# Patient Record
Sex: Male | Born: 1945
Health system: Southern US, Community
[De-identification: ages and names within clinical notes are randomized; demographics above are authoritative.]

## PROBLEM LIST (undated history)

## (undated) DIAGNOSIS — E559 Vitamin D deficiency, unspecified: Secondary | ICD-10-CM

## (undated) DIAGNOSIS — E119 Type 2 diabetes mellitus without complications: Secondary | ICD-10-CM

## (undated) DIAGNOSIS — M79606 Pain in leg, unspecified: Secondary | ICD-10-CM

## (undated) DIAGNOSIS — I10 Essential (primary) hypertension: Secondary | ICD-10-CM

## (undated) DIAGNOSIS — F17200 Nicotine dependence, unspecified, uncomplicated: Secondary | ICD-10-CM

## (undated) DIAGNOSIS — H538 Other visual disturbances: Secondary | ICD-10-CM

## (undated) DIAGNOSIS — F418 Other specified anxiety disorders: Secondary | ICD-10-CM

## (undated) DIAGNOSIS — E7800 Pure hypercholesterolemia, unspecified: Secondary | ICD-10-CM

## (undated) DIAGNOSIS — Z9889 Other specified postprocedural states: Secondary | ICD-10-CM

## (undated) DIAGNOSIS — M5416 Radiculopathy, lumbar region: Secondary | ICD-10-CM

## (undated) DIAGNOSIS — R269 Unspecified abnormalities of gait and mobility: Secondary | ICD-10-CM

## (undated) DIAGNOSIS — K7010 Alcoholic hepatitis without ascites: Secondary | ICD-10-CM

## (undated) HISTORY — DX: Other specified anxiety disorders: F41.8

## (undated) HISTORY — PX: COLONOSCOPY: SHX174

## (undated) HISTORY — DX: Other visual disturbances: H53.8

## (undated) HISTORY — DX: Unspecified abnormalities of gait and mobility: R26.9

## (undated) HISTORY — DX: Nicotine dependence, unspecified, uncomplicated: F17.200

## (undated) HISTORY — DX: Pure hypercholesterolemia, unspecified: E78.00

## (undated) HISTORY — DX: Alcoholic hepatitis without ascites: K70.10

## (undated) HISTORY — DX: Type 2 diabetes mellitus without complications: E11.9

## (undated) HISTORY — DX: Other specified postprocedural states: Z98.890

## (undated) HISTORY — DX: Pain in leg, unspecified: M79.606

## (undated) HISTORY — DX: Essential (primary) hypertension: I10

## (undated) HISTORY — PX: BRAIN SURGERY: SHX531

## (undated) HISTORY — DX: Vitamin D deficiency, unspecified: E55.9

## (undated) HISTORY — DX: Radiculopathy, lumbar region: M54.16

---

## 2019-09-10 LAB — CBC AND DIFFERENTIAL
HCT: 48 (ref 41–53)
Hemoglobin: 15.1 (ref 13.5–17.5)
Platelets: 193 (ref 150–399)
WBC: 11.8

## 2019-09-10 LAB — CBC: RBC: 5.22 — AB (ref 3.87–5.11)

## 2020-06-11 ENCOUNTER — Other Ambulatory Visit: Payer: Self-pay

## 2020-06-11 ENCOUNTER — Encounter: Payer: Self-pay | Admitting: Family

## 2020-06-11 ENCOUNTER — Ambulatory Visit (INDEPENDENT_AMBULATORY_CARE_PROVIDER_SITE_OTHER): Payer: Medicare Other | Admitting: Family

## 2020-06-11 VITALS — BP 164/92 | HR 70 | Temp 97.8°F | Resp 16 | Ht 67.0 in | Wt 164.0 lb

## 2020-06-11 DIAGNOSIS — I10 Essential (primary) hypertension: Secondary | ICD-10-CM

## 2020-06-11 DIAGNOSIS — Z1159 Encounter for screening for other viral diseases: Secondary | ICD-10-CM

## 2020-06-11 DIAGNOSIS — R630 Anorexia: Secondary | ICD-10-CM | POA: Diagnosis not present

## 2020-06-11 DIAGNOSIS — M25551 Pain in right hip: Secondary | ICD-10-CM

## 2020-06-11 DIAGNOSIS — H538 Other visual disturbances: Secondary | ICD-10-CM

## 2020-06-11 DIAGNOSIS — G4700 Insomnia, unspecified: Secondary | ICD-10-CM

## 2020-06-11 DIAGNOSIS — G3184 Mild cognitive impairment, so stated: Secondary | ICD-10-CM

## 2020-06-11 DIAGNOSIS — Z23 Encounter for immunization: Secondary | ICD-10-CM

## 2020-06-11 DIAGNOSIS — L309 Dermatitis, unspecified: Secondary | ICD-10-CM

## 2020-06-11 DIAGNOSIS — L602 Onychogryphosis: Secondary | ICD-10-CM

## 2020-06-11 DIAGNOSIS — G8929 Other chronic pain: Secondary | ICD-10-CM

## 2020-06-11 MED ORDER — AMLODIPINE BESYLATE 5 MG PO TABS: 5.0000 mg | ORAL_TABLET | Freq: Every day | ORAL | 3 refills | Status: DC

## 2020-06-11 MED ORDER — TETANUS-DIPHTH-ACELL PERTUSSIS 5-2.5-18.5 LF-MCG/0.5 IM SUSP: 0.5000 mL | Freq: Once | INTRAMUSCULAR | 0 refills | Status: AC

## 2020-06-11 MED ORDER — MIRTAZAPINE 7.5 MG PO TABS: 7.5000 mg | ORAL_TABLET | Freq: Every day | ORAL | 3 refills | Status: DC

## 2020-06-11 NOTE — Progress Notes (Signed)
Provider: Marlowe Sax FNP-C   Zadaya Cuadra, Nelda Bucks, NP  Patient Care Team: Burwell Bethel, Nelda Bucks, NP as PCP - General (Family Medicine)  Extended Emergency Contact Information Primary Emergency Contact: Elwin Sleight Mobile Phone: [phone] Relation: Other Secondary Emergency Contact: Milagros Evener Address: [address]          [place], [place] [address] Johnnette Litter of [place] Phone: [phone] Mobile Phone: [phone] Relation: Son  Code Status:Full Code  Goals of care: Advanced Directive information Advanced Directives 06/11/2020  Does Patient Have a Medical Advance Directive? No  Would patient like information on creating a medical advance directive? No - Patient declined     Chief Complaint  Patient presents with  . Establish Care    New Patient.    HPI:  Pt is a 74 y.o. male seen today for medical management of chronic diseases.He is here with daughter in law.He has a medical history of hypertension,hyperlipidemia though history limited due poor historian and memory issues.Has moved in with daughter and her husband used to take care of his own medication but apparently has not been taking his medication.  Hypertension -  Has missed his blood pressure medication more than two months.does not recall name of medication.No medication bottles brought to visit.[place] with daughter during visit concerning taking medication.Daughter states will be in charge of his medication from now on.  Insomnia - states not sleeping well at night.Daughter states stays in his room whole day watching TV rarely goes outside.He does not eat with family during visit.he takes his food to his bedroom.   Memory loss - forgetful.Not taking medication as above. He denies any symptoms of depression. Poor appetite - not eating well and skips meals.   Past Medical History:  Diagnosis Date  . Alcoholic hepatitis   . Anxiety associated with depression   . Blurred vision   . Gait disturbance    . High blood pressure    Per [facility] Patient Packet   . High cholesterol     Per [facility] New Patient Packet   . History of brain surgery   . Lumbar radiculopathy   . Lumbar radiculopathy   . Nicotine addiction   . Pain in leg, unspecified   . Type 2 diabetes mellitus ([place])   . Vitamin D deficiency    History reviewed. No pertinent surgical history.  Allergies  Allergen Reactions  . Penicillins     Allergies as of 06/11/2020   No Known Allergies     Medication List       Accurate as of June 11, 2020 11:59 PM. If you have any questions, ask your nurse or doctor.        amLODipine 5 MG tablet Commonly known as: NORVASC Take 1 tablet (5 mg total) by mouth daily. Started by: Sandrea Hughs, NP   mirtazapine 7.5 MG tablet Commonly known as: REMERON Take 1 tablet (7.5 mg total) by mouth at bedtime. Started by: Sandrea Hughs, NP   Tdap 5-2.5-18.5 LF-MCG/0.5 injection Commonly known as: BOOSTRIX Inject 0.5 mLs into the muscle once for 1 dose.       Review of Systems  Constitutional: Negative for chills, fatigue and fever.       Eats sparingly mostly eats dinner.  HENT: Negative for congestion, rhinorrhea, sinus pressure, sinus pain, sneezing, sore throat and trouble swallowing.   Eyes: Positive for visual disturbance. Negative for pain, discharge, redness and itching.  Respiratory: Negative for cough, chest tightness, shortness of breath and  wheezing.   Cardiovascular: Negative for chest pain, palpitations and leg swelling.  Gastrointestinal: Negative for abdominal pain, blood in stool, constipation, diarrhea, nausea and vomiting.  Endocrine: Negative for cold intolerance, heat intolerance, polydipsia, polyphagia and polyuria.  Genitourinary: Negative for difficulty urinating, dysuria, flank pain, frequency, hematuria and urgency.  Musculoskeletal: Positive for arthralgias and gait problem. Negative for joint swelling, myalgias and neck pain.  Skin: Negative for  color change, pallor, rash and wound.  Neurological: Negative for dizziness, speech difficulty, weakness, light-headedness, numbness and headaches.  Hematological: Does not bruise/bleed easily.  Psychiatric/Behavioral: Positive for sleep disturbance. Negative for agitation and confusion. The patient is not nervous/anxious.        Doesn't sleep at all for about a year.  Stays a lot in the room and doesn't feeling like doing anything.  Forgetful,     There is no immunization history on file for this patient. Pertinent  Health Maintenance Due  Topic Date Due  . COLONOSCOPY  Never done  . PNA vac Low Risk Adult (1 of 2 - PCV13) Never done  . INFLUENZA VACCINE  06/30/2020   Fall Risk  06/11/2020  Falls in the past year? 1  Number falls in past yr: 0  Injury with Fall? 0    Vitals:   06/11/20 1325 06/11/20 1549  BP: (!) 178/100 (!) 164/92  Pulse: 87 70  Resp: 16   Temp: 97.8 F (36.6 C)   SpO2: 98%   Weight: 164 lb (74.4 kg)   Height: '5\' 7"'$  (1.702 m)    Body mass index is 25.69 kg/m. Physical Exam Vitals reviewed.  Constitutional:      General: He is not in acute distress.    Appearance: He is overweight. He is not ill-appearing.  HENT:     Head: Normocephalic.     Right Ear: Tympanic membrane, ear canal and external ear normal. There is no impacted cerumen.     Left Ear: Tympanic membrane, ear canal and external ear normal. There is no impacted cerumen.     Nose: Nose normal. No congestion or rhinorrhea.     Mouth/Throat:     Mouth: Mucous membranes are moist.     Pharynx: Oropharynx is clear. No oropharyngeal exudate or posterior oropharyngeal erythema.  Eyes:     General: No scleral icterus.       Right eye: No discharge.        Left eye: No discharge.     Extraocular Movements: Extraocular movements intact.     Conjunctiva/sclera: Conjunctivae normal.     Pupils: Pupils are equal, round, and reactive to light.  Neck:     Vascular: No carotid bruit.    Cardiovascular:     Rate and Rhythm: Normal rate and regular rhythm.     Pulses: Normal pulses.     Heart sounds: Normal heart sounds. No murmur heard.  No friction rub. No gallop.   Pulmonary:     Effort: Pulmonary effort is normal. No respiratory distress.     Breath sounds: Normal breath sounds. No wheezing, rhonchi or rales.  Chest:     Chest wall: No tenderness.  Abdominal:     General: Bowel sounds are normal. There is no distension.     Palpations: Abdomen is soft. There is no mass.     Tenderness: There is no abdominal tenderness. There is no right CVA tenderness, left CVA tenderness, guarding or rebound.  Musculoskeletal:        General: No swelling.  Cervical back: Normal range of motion. No rigidity or tenderness.     Right hip: Normal.     Left hip: Tenderness present. No deformity or crepitus. Normal range of motion. Normal strength.     Right lower leg: No edema.     Left lower leg: No edema.  Lymphadenopathy:     Cervical: No cervical adenopathy.  Skin:    General: Skin is warm and dry.     Coloration: Skin is not pale.     Findings: No bruising, erythema, lesion or rash.     Comments: Chronic dry skin on antecubital and back of the neck.  Neurological:     Mental Status: He is alert.     Cranial Nerves: No cranial nerve deficit.     Sensory: No sensory deficit.     Motor: No weakness.     Coordination: Coordination normal.     Gait: Gait normal.  Psychiatric:        Mood and Affect: Mood normal.        Speech: Speech normal.        Behavior: Behavior normal.        Thought Content: Thought content normal.        Cognition and Memory: Memory is impaired.        Judgment: Judgment normal.     Comments: Argues with daughter during visit over medication     Labs reviewed: Significant Diagnostic Results in last 30 days:  No results found.  Assessment/Plan 1. Essential hypertension Uncontrolled.Has missed his blood medication does not recall the name  of the medication.B/p elevated today.Clonidine 0.1 mg tablet x 1 dose B/p rechecked trending down.Asymptomatic.will start on Amlodipine 5 mg tablet daily.Daughter advised to check B/p at home and notify if > 140/90 - will follow up    - CBC with Differential/Platelet - CMP with eGFR([facility]) - TSH; Future - Lipid panel; Future - amLODipine (NORVASC) 5 MG tablet; Take 1 tablet (5 mg total) by mouth daily.  Dispense: 90 tablet; Refill: 3  2. Insomnia, unspecified type Not sleeping at night though spends most time in the bedroom during the day.Encouraged to get out of his bedroom during the day and exercise by walking.will start on Remeron at bedtime for insomnia and appetite stimulant. - mirtazapine (REMERON) 7.5 MG tablet; Take 1 tablet (7.5 mg total) by mouth at bedtime.  Dispense: 30 tablet; Refill: 3  3. Decreased appetite Start on Remeron as below.   - mirtazapine (REMERON) 7.5 MG tablet; Take 1 tablet (7.5 mg total) by mouth at bedtime.  Dispense: 30 tablet; Refill: 3  4. Overgrown toenails Refer to podiatrist to trim overgrown toenails.  - Ambulatory referral to Podiatry  5. Chronic hip pain, right Tender to deep palpation.OTC Tylenol 500 mg tablet every 6 hrs as needed for pain.Will obtain imaging.  - DG Hip Unilat W OR W/O Pelvis 2-3 Views Left; Future  6. Eczema, unspecified type Advised to apply moisturizing ointment.Aquaphor daily and as needed.   7. Need for hepatitis C screening test No previous screening.Asymptomatic.  - Hep C Antibody  8. Need for Tdap vaccination Advised to get Tdap vaccine at his pharmacy. - Tdap (BOOSTRIX) 5-2.5-18.5 LF-MCG/0.5 injection; Inject 0.5 mLs into the muscle once for 1 dose.  Dispense: 0.5 mL; Refill: 0  9. Blurry vision, bilateral Unclear etiology though could be also related to his uncontrolled B/p due to not taking his blood pressure medication.Daughter will take over his medication management.Will refer to Ophthalmology.  -  Ambulatory referral to Ophthalmology  10. Mild Cognitive impairment  Daughter reports patient forgetful and not taking his medication as directed.Scored 18 out of 30 on MMSE.Has had decreased appetite. Will obtain lab work.will refer to Neurologist for further evaluation.  Family/ staff Communication: Reviewed plan of care with patient and daughter verbalized understanding.  Labs/tests ordered:  - CBC with Differential/Platelet - CMP with eGFR([facility]) - TSH; Future - Lipid panel; Future - Hep C Antibody  Next Appointment : 2 weeks for B/P check and decreased appetite.   Sandrea Hughs, NP

## 2020-06-12 LAB — CBC WITH DIFFERENTIAL/PLATELET
Absolute Monocytes: 636 cells/uL (ref 200–950)
Basophils Absolute: 30 cells/uL (ref 0–200)
Basophils Relative: 0.4 %
Eosinophils Absolute: 22 cells/uL (ref 15–500)
Eosinophils Relative: 0.3 %
HCT: 51.1 % — ABNORMAL HIGH (ref 38.5–50.0)
Hemoglobin: 17.1 g/dL (ref 13.2–17.1)
Lymphs Abs: 2161 cells/uL (ref 850–3900)
MCH: 30.6 pg (ref 27.0–33.0)
MCHC: 33.5 g/dL (ref 32.0–36.0)
MCV: 91.4 fL (ref 80.0–100.0)
MPV: 10.9 fL (ref 7.5–12.5)
Monocytes Relative: 8.6 %
Neutro Abs: 4551 cells/uL (ref 1500–7800)
Neutrophils Relative %: 61.5 %
Platelets: 176 10*3/uL (ref 140–400)
RBC: 5.59 10*6/uL (ref 4.20–5.80)
RDW: 12.7 % (ref 11.0–15.0)
Total Lymphocyte: 29.2 %
WBC: 7.4 10*3/uL (ref 3.8–10.8)

## 2020-06-12 LAB — COMPLETE METABOLIC PANEL WITH GFR
AG Ratio: 1.6 (calc) (ref 1.0–2.5)
ALT: 45 U/L (ref 9–46)
AST: 27 U/L (ref 10–35)
Albumin: 4.6 g/dL (ref 3.6–5.1)
Alkaline phosphatase (APISO): 78 U/L (ref 35–144)
BUN/Creatinine Ratio: 10 (calc) (ref 6–22)
BUN: 15 mg/dL (ref 7–25)
CO2: 25 mmol/L (ref 20–32)
Calcium: 10 mg/dL (ref 8.6–10.3)
Chloride: 104 mmol/L (ref 98–110)
Creat: 1.47 mg/dL — ABNORMAL HIGH (ref 0.70–1.18)
GFR, Est African American: 54 mL/min/{1.73_m2} — ABNORMAL LOW (ref >=60)
GFR, Est Non African American: 47 mL/min/{1.73_m2} — ABNORMAL LOW (ref >=60)
Globulin: 2.8 g/dL (calc) (ref 1.9–3.7)
Glucose, Bld: 103 mg/dL — ABNORMAL HIGH (ref 65–99)
Potassium: 4.1 mmol/L (ref 3.5–5.3)
Sodium: 142 mmol/L (ref 135–146)
Total Bilirubin: 0.7 mg/dL (ref 0.2–1.2)
Total Protein: 7.4 g/dL (ref 6.1–8.1)

## 2020-06-12 LAB — HEPATITIS C ANTIBODY
Hepatitis C Ab: NONREACTIVE
SIGNAL TO CUT-OFF: 0.02 (ref <1.00)

## 2020-06-14 ENCOUNTER — Ambulatory Visit: Payer: Medicare Other | Admitting: Family

## 2020-06-19 ENCOUNTER — Other Ambulatory Visit: Payer: Self-pay

## 2020-06-19 DIAGNOSIS — R739 Hyperglycemia, unspecified: Secondary | ICD-10-CM

## 2020-06-21 ENCOUNTER — Ambulatory Visit: Payer: Self-pay | Admitting: Family

## 2020-06-21 ENCOUNTER — Other Ambulatory Visit: Payer: Self-pay

## 2020-06-25 ENCOUNTER — Other Ambulatory Visit: Payer: Medicare Other

## 2020-06-25 ENCOUNTER — Other Ambulatory Visit: Payer: Self-pay

## 2020-06-25 ENCOUNTER — Encounter: Payer: Self-pay | Admitting: Family

## 2020-06-25 ENCOUNTER — Ambulatory Visit (INDEPENDENT_AMBULATORY_CARE_PROVIDER_SITE_OTHER): Payer: Medicare Other | Admitting: Family

## 2020-06-25 VITALS — BP 130/80 | HR 74 | Temp 97.3°F | Resp 16 | Ht 67.0 in | Wt 158.6 lb

## 2020-06-25 DIAGNOSIS — G3184 Mild cognitive impairment, so stated: Secondary | ICD-10-CM

## 2020-06-25 DIAGNOSIS — M25551 Pain in right hip: Secondary | ICD-10-CM

## 2020-06-25 DIAGNOSIS — Z7289 Other problems related to lifestyle: Secondary | ICD-10-CM

## 2020-06-25 DIAGNOSIS — Z8603 Personal history of neoplasm of uncertain behavior: Secondary | ICD-10-CM

## 2020-06-25 DIAGNOSIS — I10 Essential (primary) hypertension: Secondary | ICD-10-CM

## 2020-06-25 DIAGNOSIS — E785 Hyperlipidemia, unspecified: Secondary | ICD-10-CM

## 2020-06-25 DIAGNOSIS — Z9889 Other specified postprocedural states: Secondary | ICD-10-CM

## 2020-06-25 DIAGNOSIS — F17200 Nicotine dependence, unspecified, uncomplicated: Secondary | ICD-10-CM

## 2020-06-25 DIAGNOSIS — G8929 Other chronic pain: Secondary | ICD-10-CM

## 2020-06-25 DIAGNOSIS — F418 Other specified anxiety disorders: Secondary | ICD-10-CM | POA: Diagnosis not present

## 2020-06-25 DIAGNOSIS — F1090 Alcohol use, unspecified, uncomplicated: Secondary | ICD-10-CM

## 2020-06-25 DIAGNOSIS — Z716 Tobacco abuse counseling: Secondary | ICD-10-CM

## 2020-06-25 DIAGNOSIS — Z789 Other specified health status: Secondary | ICD-10-CM

## 2020-06-25 MED ORDER — SERTRALINE HCL 25 MG PO TABS: 50.0000 mg | ORAL_TABLET | Freq: Every day | ORAL | 0 refills | Status: DC

## 2020-06-25 MED ORDER — SERTRALINE HCL 25 MG PO TABS: 25.0000 mg | ORAL_TABLET | Freq: Every day | ORAL | 0 refills | Status: DC

## 2020-06-25 MED ORDER — ACETAMINOPHEN 500 MG PO TABS: 500.0000 mg | ORAL_TABLET | Freq: Four times a day (QID) | ORAL | 0 refills | Status: DC | PRN

## 2020-06-25 NOTE — Patient Instructions (Signed)
-   please get right hip X-ray done at [facility] [place]  - Referral placed to Neurologist specialist office will call you.

## 2020-06-25 NOTE — Progress Notes (Signed)
Provider: Marlowe Sax FNP-C  Terris Bodin, Nelda Bucks, NP  Patient Care Team: Kyah Buesing, Nelda Bucks, NP as PCP - General (Family Medicine)  Extended Emergency Contact Information Primary Emergency Contact: Elwin Sleight Mobile Phone: [phone] Relation: Other Secondary Emergency Contact: Milagros Evener Address: [address]          [place], [place] [address] Johnnette Litter of [place] Phone: [phone] Mobile Phone: [phone] Relation: Son  Code Status: Full Code  Goals of care: Advanced Directive information Advanced Directives 06/25/2020  Does Patient Have a Medical Advance Directive? No  Would patient like information on creating a medical advance directive? No - Patient declined     Chief Complaint  Patient presents with  . Acute Visit    Mental Changes.    HPI:  Pt is a 74 y.o. male seen today for an acute visit for evaluation of mental status changes.He was here 06/11/2020 to establish care with [facility] for medical management of chronic issues.He didn't have his medication bottles and had not take his blood pressure medication for several months.Daughter was not sure if he was on other medication.His medical records  from previous PCP in [place] were not available for review.Records have been obtained today Dr. Costella Hatcher [facility] encounter 07/18/2019 indicates medical history of Hypertension,Hyperlipidemia,lumbar radiculopathy,leg pain,Anxiety with Depression,Alcohol abuse,cigarrette smoking among other condition.He is here today with the son who states patient also had a brain tumor removed on right side around 2009 and a metal plate was placed.He states tumor was not cancerous.He wonders whether tumor could have recurred and causing patient's forgetfulness.Son states patient does things then will argue that he didn't do it.He will use the toilet and not wipe the toilet seat when ask he will say did uses the toilet.he tends to ask questions over and over.     Remeron was prescribed on previous visit for appetite and insomnia.He states appetite has improved and sleeping better after the 11 pm news. Son reports patient cries at sometimes staying all his family members are dead though has some nieces and nephews.   Alcohol use - has not drunk alcohol for the past three days per son.  Smoking cigarettes - smokes 5-10 cigarettes per day per son but patient thinks he smokes only one per day.   He was referred to podiatrist on previous visit states has appointment on 06/27/2020.Also referred to Ophthalmologist for blurry vision but has not had a call yet.  Right hip - X-ray ordered on previous visit but has not gone.son states will taking him today.   Hypertension - His blood pressure has improved.states taking amlodipine as directed on previous visit. No home readings for review.   Past Medical History:  Diagnosis Date  . Alcoholic hepatitis   . Anxiety associated with depression   . Blurred vision   . Gait disturbance   . High blood pressure    Per [facility] Patient Packet   . High cholesterol     Per [facility] New Patient Packet   . History of brain surgery   . Lumbar radiculopathy   . Lumbar radiculopathy   . Nicotine addiction   . Pain in leg, unspecified   . Type 2 diabetes mellitus ([place])   . Vitamin D deficiency    History reviewed. No pertinent surgical history.  Allergies  Allergen Reactions  . Penicillins     Outpatient Encounter Medications as of 06/25/2020  Medication Sig  . amLODipine (NORVASC) 5 MG tablet Take 1 tablet (  5 mg total) by mouth daily.  . mirtazapine (REMERON) 7.5 MG tablet Take 1 tablet (7.5 mg total) by mouth at bedtime.   No facility-administered encounter medications on file as of 06/25/2020.    Review of Systems  Constitutional: Negative for chills, fatigue and fever.       Appetite has improve.   HENT: Negative for congestion, rhinorrhea, sinus pressure, sinus pain, sneezing and sore throat.   Eyes:  Positive for visual disturbance. Negative for discharge, redness and itching.       Blurry vision   Respiratory: Negative for cough, chest tightness, shortness of breath and wheezing.   Cardiovascular: Negative for chest pain, palpitations and leg swelling.  Gastrointestinal: Negative for abdominal distention, abdominal pain, constipation, diarrhea, nausea and vomiting.  Musculoskeletal: Positive for arthralgias and gait problem.       Right hip pain   Skin: Negative for color change, pallor and rash.  Neurological: Negative for dizziness, seizures, speech difficulty, weakness, light-headedness, numbness and headaches.  Psychiatric/Behavioral: Negative for agitation, behavioral problems and confusion. The patient is nervous/anxious.     Immunization History  Administered Date(s) Administered  . Pneumococcal Conjugate-13 07/02/2009   Pertinent  Health Maintenance Due  Topic Date Due  . COLONOSCOPY  Never done  . PNA vac Low Risk Adult (2 of 2 - PPSV23) 04/16/2012  . INFLUENZA VACCINE  06/30/2020   Fall Risk  06/25/2020 06/11/2020  Falls in the past year? 0 1  Number falls in past yr: 0 0  Injury with Fall? 0 0    Vitals:   06/25/20 0849  BP: (!) 130/80  Pulse: 74  Resp: 16  Temp: (!) 97.3 F (36.3 C)  SpO2: 94%  Weight: 158 lb 9.6 oz (71.9 kg)  Height: 5\' 7"  (1.702 m)   Body mass index is 24.84 kg/m. Physical Exam Vitals reviewed.  Constitutional:      General: He is not in acute distress.    Appearance: He is normal weight. He is not ill-appearing.  HENT:     Head: Normocephalic.     Nose: No congestion or rhinorrhea.     Mouth/Throat:     Mouth: Mucous membranes are moist.     Pharynx: Oropharynx is clear. No oropharyngeal exudate or posterior oropharyngeal erythema.  Eyes:     General: No scleral icterus.       Right eye: No discharge.        Left eye: No discharge.     Extraocular Movements: Extraocular movements intact.     Conjunctiva/sclera: Conjunctivae  normal.     Pupils: Pupils are equal, round, and reactive to light.  Neck:     Vascular: No carotid bruit.  Cardiovascular:     Rate and Rhythm: Normal rate and regular rhythm.     Pulses: Normal pulses.     Heart sounds: Normal heart sounds. No murmur heard.  No friction rub. No gallop.   Pulmonary:     Effort: Pulmonary effort is normal. No respiratory distress.     Breath sounds: Normal breath sounds. No wheezing, rhonchi or rales.  Chest:     Chest wall: No tenderness.  Abdominal:     General: Bowel sounds are normal. There is no distension.     Palpations: Abdomen is soft. There is no mass.     Tenderness: There is no abdominal tenderness. There is no right CVA tenderness, left CVA tenderness, guarding or rebound.  Musculoskeletal:        General: No swelling.  Cervical back: Normal range of motion. No rigidity or tenderness.     Right hip: Tenderness present. No crepitus. Normal range of motion. Normal strength.     Left hip: No bony tenderness or crepitus. Normal range of motion. Normal strength.     Right lower leg: No edema.     Left lower leg: No edema.     Comments: Unsteady gait walks with a cane   Lymphadenopathy:     Cervical: No cervical adenopathy.  Skin:    General: Skin is warm and dry.     Coloration: Skin is not pale.     Findings: No bruising, erythema or rash.  Neurological:     Mental Status: Mental status is at baseline.     Cranial Nerves: No cranial nerve deficit.     Motor: No weakness.     Gait: Gait abnormal.  Psychiatric:        Mood and Affect: Mood is anxious and depressed.        Speech: Speech normal.        Behavior: Behavior is cooperative.        Thought Content: Thought content normal.        Cognition and Memory: Memory is impaired.        Judgment: Judgment normal.     Comments: Argues with son during visit      Labs reviewed: Recent Labs    06/11/20 1549  NA 142  K 4.1  CL 104  CO2 25  GLUCOSE 103*  BUN 15    CREATININE 1.47*  CALCIUM 10.0   Recent Labs    06/11/20 1549  AST 27  ALT 45  BILITOT 0.7  PROT 7.4   Recent Labs    09/10/19 0000 06/11/20 1549  WBC 11.8 7.4  NEUTROABS  --  4,551  HGB 15.1 17.1  HCT 48 51.1*  MCV  --  91.4  PLT 193 176    Significant Diagnostic Results in last 30 days:  No results found.  Assessment/Plan 1. Mild cognitive impairment Scored 19/30 on MMSE previous was 18/30  forgetful.Has history of right brain Tumor removal ? 2009 with plate placement.His poor dietary intake,alcohol uses could be a contributory factor for his memory loss.Will rule out acute abnormalities. - Ambulatory referral to Neurology for further evaluation. - Vitamin B12 - RPR - HIV Antibody (routine testing w rflx) - TSH  2. Hyperlipidemia LDL goal <100 Previous records indicates was on Statin.will recheck lipid panel then start on statin if indicated.   3. Depression with anxiety Cries sometimes tends to stay in his room and eats in his room away from family. Start on Sertraline.side effects discussed. - sertraline (ZOLOFT) 25 MG tablet; Take 1 tablet (25 mg total) by mouth daily.  Dispense: 30 tablet; Refill: 0 - has upcoming follow up appointment.   4. Essential hypertension B/p at goal today.continue on amlodipine 5 mg tablet daily.  - TSH  5. Chronic hip pain, right X-ray ordered recently but did get it done.son will take him for imaging today. Advised to take Tylenol as below.  - acetaminophen (TYLENOL) 500 MG tablet; Take 1 tablet (500 mg total) by mouth every 6 (six) hours as needed.  Dispense: 30 tablet; Refill: 0  6. Hx of excision of tumor of brain meninges Per son tumor removed on right side of the head around 2009.No records for review.Incision scar noted on right side of the head.  7. Alcohol use Has not had alcohol in  the past three days.Ceassation advised.   8. Tobacco use disorder Smokes 5-10 cigarettes per day.Ceassation advised.will consider  switching to Wellbutrin for both depression and smoking cessation.   9. Encounter for smoking cessation counseling Not ready to quit.will continue to encourage smoking cessation.   Family/ staff Communication: Reviewed plan of care with patient and son verbalized understanding.   Labs/tests ordered:  - Vitamin B12 - RPR - HIV Antibody (routine testing w rflx) - TSH - Lipid panel   Next Appointment: Has appointment 07/05/2020.    Sandrea Hughs, NP

## 2020-06-27 ENCOUNTER — Other Ambulatory Visit: Payer: Self-pay

## 2020-06-27 ENCOUNTER — Ambulatory Visit (INDEPENDENT_AMBULATORY_CARE_PROVIDER_SITE_OTHER): Payer: Medicare Other | Admitting: Podiatry

## 2020-06-27 DIAGNOSIS — B351 Tinea unguium: Secondary | ICD-10-CM | POA: Diagnosis not present

## 2020-06-27 NOTE — Progress Notes (Signed)
  Subjective:  Patient ID: Alexander Pruitt, male    DOB: August 08, 1946,  MRN: [id number]  Chief Complaint  Patient presents with  . Nail Problem    pt is here for bil nail trim.     74 y.o. male presents with the above complaint. History confirmed with patient. Denies DM, other significant medical issues. No pain at the nails.  Objective:  Physical Exam: warm, good capillary refill, no trophic changes or ulcerative lesions, normal DP and PT pulses and normal sensory exam. Elongated toenails with dystrophic changes of hallux Left Foot: normal exam, no swelling, tenderness, instability; ligaments intact, full range of motion of all ankle/foot joints  Right Foot: normal exam, no swelling, tenderness, instability; ligaments intact, full range of motion of all ankle/foot joints   Assessment:   1. Onychomycosis      Plan:  Patient was evaluated and treated and all questions answered.  Onychomycosis  -Does not meet criteria for routine care. Nails debrided today, courtesy. Advised further debridement would likely be non-covered.  Procedure: Nail Debridement Type of Debridement: manual, sharp debridement. Instrumentation: Nail nipper, rotary burr. Number of Nails: 10    No follow-ups on file.

## 2020-07-01 ENCOUNTER — Ambulatory Visit
Admission: RE | Admit: 2020-07-01 | Discharge: 2020-07-01 | Disposition: A | Discharge status: Home / Self Care | Payer: Medicare Other | Source: Ambulatory Visit | Attending: Family | Admitting: Family

## 2020-07-01 ENCOUNTER — Other Ambulatory Visit: Payer: Medicare Other

## 2020-07-01 ENCOUNTER — Other Ambulatory Visit: Payer: Self-pay

## 2020-07-01 ENCOUNTER — Other Ambulatory Visit: Payer: Self-pay | Admitting: Family

## 2020-07-01 DIAGNOSIS — M1612 Unilateral primary osteoarthritis, left hip: Secondary | ICD-10-CM | POA: Diagnosis not present

## 2020-07-01 DIAGNOSIS — G8929 Other chronic pain: Secondary | ICD-10-CM

## 2020-07-01 DIAGNOSIS — R739 Hyperglycemia, unspecified: Secondary | ICD-10-CM | POA: Diagnosis not present

## 2020-07-01 DIAGNOSIS — G3184 Mild cognitive impairment, so stated: Secondary | ICD-10-CM | POA: Diagnosis not present

## 2020-07-01 DIAGNOSIS — M25551 Pain in right hip: Secondary | ICD-10-CM

## 2020-07-01 DIAGNOSIS — I10 Essential (primary) hypertension: Secondary | ICD-10-CM | POA: Diagnosis not present

## 2020-07-02 LAB — HEMOGLOBIN A1C
Hgb A1c MFr Bld: 5.2 % of total Hgb (ref <5.7)
Mean Plasma Glucose: 103 (calc)
eAG (mmol/L): 5.7 (calc)

## 2020-07-02 LAB — LIPID PANEL
Cholesterol: 228 mg/dL — ABNORMAL HIGH (ref <200)
HDL: 51 mg/dL (ref >=40)
LDL Cholesterol (Calc): 157 mg/dL (calc) — ABNORMAL HIGH
Non-HDL Cholesterol (Calc): 177 mg/dL (calc) — ABNORMAL HIGH (ref <130)
Total CHOL/HDL Ratio: 4.5 (calc) (ref <5.0)
Triglycerides: 96 mg/dL (ref <150)

## 2020-07-02 LAB — SYPHILIS: RPR W/REFLEX TO RPR TITER AND TREPONEMAL ANTIBODIES, TRADITIONAL SCREENING AND DIAGNOSIS ALGORITHM: RPR Ser Ql: NONREACTIVE

## 2020-07-02 LAB — TSH: TSH: 1.78 mIU/L (ref 0.40–4.50)

## 2020-07-02 LAB — VITAMIN B12: Vitamin B-12: 748 pg/mL (ref 200–1100)

## 2020-07-02 LAB — HIV ANTIBODY (ROUTINE TESTING W REFLEX): HIV 1&2 Ab, 4th Generation: NONREACTIVE

## 2020-07-03 ENCOUNTER — Ambulatory Visit: Payer: Medicare Other | Admitting: Diagnostic Neuroimaging

## 2020-07-03 ENCOUNTER — Encounter: Payer: Self-pay | Admitting: Diagnostic Neuroimaging

## 2020-07-03 ENCOUNTER — Telehealth: Payer: Self-pay | Admitting: *Deleted

## 2020-07-03 NOTE — Telephone Encounter (Signed)
Patient was no show for new patient appointment today. 

## 2020-07-04 ENCOUNTER — Other Ambulatory Visit: Payer: Self-pay

## 2020-07-04 DIAGNOSIS — E785 Hyperlipidemia, unspecified: Secondary | ICD-10-CM

## 2020-07-04 MED ORDER — ATORVASTATIN CALCIUM 10 MG PO TABS: 10.0000 mg | ORAL_TABLET | Freq: Every day | ORAL | 0 refills | Status: DC

## 2020-07-04 NOTE — Progress Notes (Signed)
cmp

## 2020-07-04 NOTE — Addendum Note (Signed)
Addended by: Bonney Leitz T on: 07/04/2020 08:52 AM   Modules accepted: Orders

## 2020-07-05 ENCOUNTER — Ambulatory Visit (INDEPENDENT_AMBULATORY_CARE_PROVIDER_SITE_OTHER): Payer: Medicare Other | Admitting: Family

## 2020-07-05 ENCOUNTER — Encounter: Payer: Self-pay | Admitting: Family

## 2020-07-05 ENCOUNTER — Other Ambulatory Visit: Payer: Self-pay

## 2020-07-05 VITALS — BP 150/90 | HR 60 | Temp 98.0°F | Resp 16 | Ht 67.0 in | Wt 160.0 lb

## 2020-07-05 DIAGNOSIS — M25551 Pain in right hip: Secondary | ICD-10-CM | POA: Diagnosis not present

## 2020-07-05 DIAGNOSIS — E785 Hyperlipidemia, unspecified: Secondary | ICD-10-CM

## 2020-07-05 DIAGNOSIS — R630 Anorexia: Secondary | ICD-10-CM

## 2020-07-05 DIAGNOSIS — F418 Other specified anxiety disorders: Secondary | ICD-10-CM | POA: Diagnosis not present

## 2020-07-05 DIAGNOSIS — G4700 Insomnia, unspecified: Secondary | ICD-10-CM

## 2020-07-05 DIAGNOSIS — G8929 Other chronic pain: Secondary | ICD-10-CM

## 2020-07-05 DIAGNOSIS — E538 Deficiency of other specified B group vitamins: Secondary | ICD-10-CM

## 2020-07-05 MED ORDER — MIRTAZAPINE 7.5 MG PO TABS: 7.5000 mg | ORAL_TABLET | Freq: Every day | ORAL | 1 refills | Status: DC

## 2020-07-05 MED ORDER — SERTRALINE HCL 25 MG PO TABS: 25.0000 mg | ORAL_TABLET | Freq: Every day | ORAL | 1 refills | Status: DC

## 2020-07-05 MED ORDER — VITAMIN B-12 1000 MCG PO TABS: 1000.0000 ug | ORAL_TABLET | Freq: Every day | ORAL | 1 refills | Status: DC

## 2020-07-05 NOTE — Progress Notes (Signed)
Provider: Marlowe Sax FNP-C  Jeramie Scogin, Nelda Bucks, NP  Patient Care Team: Quayshaun Hubbert, Nelda Bucks, NP as PCP - General (Family Medicine)  Extended Emergency Contact Information Primary Emergency Contact: Elwin Sleight Mobile Phone: [phone] Relation: Other Secondary Emergency Contact: Milagros Evener Address: [address]          [place], [place] [address] Johnnette Litter of [place] Phone: [phone] Mobile Phone: [phone] Relation: Son  Code Status:  DNR Goals of care: Advanced Directive information Advanced Directives 07/05/2020  Does Patient Have a Medical Advance Directive? No  Would patient like information on creating a medical advance directive? No - Patient declined     Chief Complaint  Patient presents with   Follow-up    2 week follow up    HPI:  Pt is a 74 y.o. male seen today for an acute visit for 2 weeks for follow up depression,anxiety,loss of appetite and insomnia.He was here 06/25/2020 with similar complains and memory loss.Remeron and sertraline was initiated.He is here with daughter.he states feeling much better.Daughter states now sleeps through the night.appetite has improved though still eats in his bedroom. He reports no side effects.His lab work reviewed chol 226,LDL 157 Lipitor was ordered but has not pick it up.Vit B12 was on the low range normal.He states he needs the energy.  Labs discussed with patient and daughter advised to pick up Lipitor    Past Medical History:  Diagnosis Date   Alcoholic hepatitis    Anxiety associated with depression    Blurred vision    Gait disturbance    High blood pressure    Per [place] New Patient Packet    High cholesterol     Per [facility] New Patient Packet    History of brain surgery    Lumbar radiculopathy    Lumbar radiculopathy    Nicotine addiction    Pain in leg, unspecified    Type 2 diabetes mellitus ([place])    Vitamin D deficiency    History reviewed. No pertinent surgical  history.  Allergies  Allergen Reactions   Penicillins     Outpatient Encounter Medications as of 07/05/2020  Medication Sig   acetaminophen (TYLENOL) 500 MG tablet Take 1 tablet (500 mg total) by mouth every 6 (six) hours as needed.   amLODipine (NORVASC) 5 MG tablet Take 1 tablet (5 mg total) by mouth daily.   atorvastatin (LIPITOR) 10 MG tablet Take 1 tablet (10 mg total) by mouth daily.   mirtazapine (REMERON) 7.5 MG tablet Take 1 tablet (7.5 mg total) by mouth at bedtime.   sertraline (ZOLOFT) 25 MG tablet Take 1 tablet (25 mg total) by mouth daily.   No facility-administered encounter medications on file as of 07/05/2020.    Review of Systems  Constitutional: Negative for chills, fatigue and fever.       Appetite has improved   HENT: Negative for congestion, rhinorrhea, sinus pressure, sinus pain, sneezing, sore throat and trouble swallowing.   Eyes: Negative for pain, discharge, redness, itching and visual disturbance.  Respiratory: Negative for cough, chest tightness, shortness of breath and wheezing.   Cardiovascular: Negative for chest pain, palpitations and leg swelling.  Gastrointestinal: Negative for abdominal distention, abdominal pain, constipation, diarrhea, nausea and vomiting.  Endocrine: Negative for heat intolerance, polydipsia, polyphagia and polyuria.  Genitourinary: Negative for difficulty urinating, dysuria, frequency and urgency.  Musculoskeletal: Positive for arthralgias and gait problem.       Right hip pain awaiting orthopedic appointment   Skin: Negative for color  change, pallor and rash.  Neurological: Negative for dizziness, speech difficulty, weakness, light-headedness, numbness and headaches.  Hematological: Does not bruise/bleed easily.  Psychiatric/Behavioral: Negative for agitation. The patient is not nervous/anxious.        Sleeps better since starting on Remeron     Immunization History  Administered Date(s) Administered   Pneumococcal  Conjugate-13 07/02/2009   Pertinent  Health Maintenance Due  Topic Date Due   COLONOSCOPY  Never done   PNA vac Low Risk Adult (2 of 2 - PPSV23) 04/16/2012   INFLUENZA VACCINE  06/30/2020   Fall Risk  07/05/2020 06/25/2020 06/11/2020  Falls in the past year? 0 0 1  Number falls in past yr: 0 0 0  Injury with Fall? 0 0 0    Vitals:   07/05/20 0824  BP: (!) 150/90  Pulse: 60  Resp: 16  Temp: 98 F (36.7 C)  SpO2: 98%  Weight: 160 lb (72.6 kg)  Height: 5\' 7"  (1.702 m)   Body mass index is 25.06 kg/m. Physical Exam Vitals reviewed.  Constitutional:      General: He is not in acute distress.    Appearance: He is not ill-appearing.  HENT:     Head: Normocephalic.     Mouth/Throat:     Mouth: Mucous membranes are moist.     Pharynx: Oropharynx is clear. No oropharyngeal exudate or posterior oropharyngeal erythema.  Eyes:     General: No scleral icterus.       Right eye: No discharge.        Left eye: No discharge.     Extraocular Movements: Extraocular movements intact.     Conjunctiva/sclera: Conjunctivae normal.     Pupils: Pupils are equal, round, and reactive to light.  Cardiovascular:     Rate and Rhythm: Normal rate and regular rhythm.     Pulses: Normal pulses.     Heart sounds: Normal heart sounds. No murmur heard.  No friction rub. No gallop.   Pulmonary:     Effort: Pulmonary effort is normal. No respiratory distress.     Breath sounds: Normal breath sounds. No wheezing, rhonchi or rales.  Chest:     Chest wall: No tenderness.  Abdominal:     General: Bowel sounds are normal. There is no distension.     Palpations: Abdomen is soft. There is no mass.     Tenderness: There is no abdominal tenderness. There is no right CVA tenderness, left CVA tenderness, guarding or rebound.  Musculoskeletal:     Right lower leg: No edema.     Left lower leg: No edema.     Comments: Unsteady gait walks with a cane.right hip pain worst with movement.   Skin:    General:  Skin is warm.     Coloration: Skin is not pale.     Findings: No bruising, erythema or rash.     Comments: Bilateral lower extremities dry scaly skin has improved with use of ointment  Neurological:     Mental Status: He is alert. Mental status is at baseline.     Cranial Nerves: No cranial nerve deficit.     Sensory: No sensory deficit.     Motor: No weakness.     Gait: Gait abnormal.  Psychiatric:        Mood and Affect: Mood normal.        Behavior: Behavior normal.        Thought Content: Thought content normal.  Judgment: Judgment normal.     Labs reviewed: Recent Labs    06/11/20 1549  NA 142  K 4.1  CL 104  CO2 25  GLUCOSE 103*  BUN 15  CREATININE 1.47*  CALCIUM 10.0   Recent Labs    06/11/20 1549  AST 27  ALT 45  BILITOT 0.7  PROT 7.4   Recent Labs    09/10/19 0000 06/11/20 1549  WBC 11.8 7.4  NEUTROABS  --  4,551  HGB 15.1 17.1  HCT 48 51.1*  MCV  --  91.4  PLT 193 176   Lab Results  Component Value Date   TSH 1.78 07/01/2020   Lab Results  Component Value Date   HGBA1C 5.2 07/01/2020   Lab Results  Component Value Date   CHOL 228 (H) 07/01/2020   HDL 51 07/01/2020   LDLCALC 157 (H) 07/01/2020   TRIG 96 07/01/2020   CHOLHDL 4.5 07/01/2020    Significant Diagnostic Results in last 30 days:  DG Hip Unilat W OR W/O Pelvis 2-3 Views Left  Result Date: 07/01/2020 CLINICAL DATA:  Chronic left hip pain EXAM: DG HIP  2-3V LEFT COMPARISON:  None. FINDINGS: Standing frontal and lateral views were obtained. No acute fracture or dislocation. There is severe narrowing of the left hip joint with bony overgrowth and subchondral cystic change consistent with osteoarthritis. There is milder narrowing of the right hip joint. No erosive change. Sacroiliac joints bilaterally appear normal. IMPRESSION: Extensive osteoarthritic change in the left hip joint with severe narrowing, subchondral cystic change, and diffuse bony overgrowth. Milder narrowing  right hip joint. No fracture or dislocation. Electronically Signed   By: Lowella Grip III M.D.   On: 07/01/2020 09:29    Assessment/Plan 1. Hyperlipidemia LDL goal <100 LDL not at goal.Lipitor 10 mg tablet daily recently ordered but has not picked up. Dietary modification advised.Exercise limited due to right hip pain.   2. Depression with anxiety Has improved.continue on sertraline 25 mg tablet daily  Monitor for mood changes.  3. Chronic hip pain, right Imaging indicated OA  - continue on Tylenol  Follow up with Orthopedic as directed.  4. Insomnia, unspecified type Sleeping much better since started on Remeron for appetite/Insomnia.   Family/ staff Communication: Reviewed plan of care with patient and daughter verbalized understanding.   Labs/tests ordered: None   Next Appointment: 6 months for medical management of chronic issue  Sandrea Hughs, NP

## 2020-07-10 ENCOUNTER — Ambulatory Visit: Payer: Medicare Other | Admitting: Orthopaedic Surgery

## 2020-07-23 ENCOUNTER — Ambulatory Visit (INDEPENDENT_AMBULATORY_CARE_PROVIDER_SITE_OTHER): Payer: Medicare Other | Admitting: Orthopaedic Surgery

## 2020-07-23 ENCOUNTER — Telehealth: Payer: Self-pay

## 2020-07-23 ENCOUNTER — Ambulatory Visit: Payer: Self-pay

## 2020-07-23 ENCOUNTER — Encounter: Payer: Self-pay | Admitting: Orthopaedic Surgery

## 2020-07-23 VITALS — Ht 68.0 in | Wt 158.6 lb

## 2020-07-23 DIAGNOSIS — R6889 Other general symptoms and signs: Secondary | ICD-10-CM | POA: Diagnosis not present

## 2020-07-23 DIAGNOSIS — M25532 Pain in left wrist: Secondary | ICD-10-CM | POA: Diagnosis not present

## 2020-07-23 DIAGNOSIS — M1612 Unilateral primary osteoarthritis, left hip: Secondary | ICD-10-CM | POA: Insufficient documentation

## 2020-07-23 NOTE — Progress Notes (Signed)
Office Visit Note   Patient: Alexander Pruitt           Date of Birth: 04-May-1946           MRN: [id number] Visit Date: 07/23/2020              Requested by: Alexander Hughs, NP [address] [place],  [place] [address] PCP: Alexander Hughs, NP   Assessment & Plan: Visit Diagnoses:  1. Pain in left wrist   2. Primary osteoarthritis of left hip     Plan: Impression is healed left distal radius fracture.  We will send him to outpatient hand therapy for this.  For the left hip OA I reviewed his recent x-rays and they show advanced end-stage DJD.  Based on his options a will try to live with this for little bit longer.  I provided him with information on hip replacement surgery.  We will see him back as needed.  He has my card if he needs to get in touch with me.  Follow-Up Instructions: Return if symptoms worsen or fail to improve.   Orders:  Orders Placed This Encounter  Procedures  . XR Wrist Complete Left   No orders of the defined types were placed in this encounter.     Procedures: No procedures performed   Clinical Data: No additional findings.   Subjective: Chief Complaint  Patient presents with  . Left Hip - Pain  . Left Wrist - Pain    Alexander Pruitt is a very pleasant 74 year old gentleman who recently relocated to [place] from [place].  He comes in for evaluation of chronic left hip and follow-up for left wrist fracture.  The left hip has been causing pain for many years.  He walks with a cane.  The pain is worse with weather changes.  He takes Tylenol for the pain.  He is unable to put on a sock.  In terms of his left wrist he [place] was placed in a cast and recently was removed.  He needs follow-up for this.   Review of Systems  Constitutional: Negative.   All other systems reviewed and are negative.    Objective: Vital Signs: There were no vitals taken for this visit.  Physical Exam Vitals and nursing note reviewed.    Constitutional:      Appearance: He is well-developed.  HENT:     Head: Normocephalic and atraumatic.  Eyes:     Pupils: Pupils are equal, round, and reactive to light.  Pulmonary:     Effort: Pulmonary effort is normal.  Abdominal:     Palpations: Abdomen is soft.  Musculoskeletal:        General: Normal range of motion.     Cervical back: Neck supple.  Skin:    General: Skin is warm.  Neurological:     Mental Status: He is alert and oriented to person, place, and time.  Psychiatric:        Behavior: Behavior normal.        Thought Content: Thought content normal.        Judgment: Judgment normal.     Ortho Exam Left wrist shows minimal restriction in range of motion.  No tenderness palpation or swelling.  No neurovascular compromise.  He does have some chronic contractures of his ring and small finger.  Left hip shows essentially no range of motion.  He has pain with internal rotation especially. Specialty Comments:  No specialty  comments available.  Imaging: XR Wrist Complete Left  Result Date: 07/23/2020 Healed radial styloid fracture.    PMFS History: Patient Active Problem List   Diagnosis Date Noted  . Primary osteoarthritis of left hip 07/23/2020  . Pain in left wrist 07/23/2020  . Mild cognitive impairment 06/25/2020  . Hyperlipidemia LDL goal <100 06/25/2020  . Depression with anxiety 06/25/2020  . Essential hypertension 06/25/2020  . Hx of excision of tumor of brain meninges 06/25/2020  . Chronic hip pain, right 06/25/2020   Past Medical History:  Diagnosis Date  . Alcoholic hepatitis   . Anxiety associated with depression   . Blurred vision   . Gait disturbance   . High blood pressure    Per [facility] Patient Packet   . High cholesterol     Per [facility] New Patient Packet   . History of brain surgery   . Lumbar radiculopathy   . Lumbar radiculopathy   . Nicotine addiction   . Pain in leg, unspecified   . Type 2 diabetes mellitus ([place])   .  Vitamin D deficiency     History reviewed. No pertinent family history.  History reviewed. No pertinent surgical history. Social History   Occupational History  . Not on file  Tobacco Use  . Smoking status: Current Some Day Smoker  . Smokeless tobacco: Never Used  . Tobacco comment: 10 cigarettes daily   Vaping Use  . Vaping Use: Never used  Substance and Sexual Activity  . Alcohol use: Not Currently    Comment: 8 drinks per week   . Drug use: Never  . Sexual activity: Not on file

## 2020-09-20 ENCOUNTER — Encounter: Payer: Self-pay | Admitting: Family

## 2020-09-20 ENCOUNTER — Ambulatory Visit (INDEPENDENT_AMBULATORY_CARE_PROVIDER_SITE_OTHER): Payer: Medicare Other | Admitting: Family

## 2020-09-20 ENCOUNTER — Other Ambulatory Visit: Payer: Self-pay

## 2020-09-20 VITALS — BP 140/82 | HR 74 | Temp 97.7°F | Ht 68.0 in | Wt 158.0 lb

## 2020-09-20 DIAGNOSIS — E785 Hyperlipidemia, unspecified: Secondary | ICD-10-CM | POA: Diagnosis not present

## 2020-09-20 DIAGNOSIS — I739 Peripheral vascular disease, unspecified: Secondary | ICD-10-CM | POA: Diagnosis not present

## 2020-09-20 DIAGNOSIS — Z1211 Encounter for screening for malignant neoplasm of colon: Secondary | ICD-10-CM | POA: Diagnosis not present

## 2020-09-20 DIAGNOSIS — L309 Dermatitis, unspecified: Secondary | ICD-10-CM

## 2020-09-20 DIAGNOSIS — Z23 Encounter for immunization: Secondary | ICD-10-CM | POA: Diagnosis not present

## 2020-09-20 DIAGNOSIS — F17209 Nicotine dependence, unspecified, with unspecified nicotine-induced disorders: Secondary | ICD-10-CM

## 2020-09-20 DIAGNOSIS — R6889 Other general symptoms and signs: Secondary | ICD-10-CM | POA: Diagnosis not present

## 2020-09-20 MED ORDER — ATORVASTATIN CALCIUM 10 MG PO TABS: 10.0000 mg | ORAL_TABLET | Freq: Every day | ORAL | 0 refills | Status: DC

## 2020-09-20 MED ORDER — HYDROCORTISONE 1 % EX OINT: 1.0000 "application " | TOPICAL_OINTMENT | Freq: Two times a day (BID) | CUTANEOUS | 1 refills | Status: DC

## 2020-09-20 MED ORDER — NICOTINE 7 MG/24HR TD PT24: 7.0000 mg | MEDICATED_PATCH | TRANSDERMAL | 0 refills | Status: DC

## 2020-09-20 MED ORDER — ACETAMINOPHEN 500 MG PO TABS: 500.0000 mg | ORAL_TABLET | Freq: Four times a day (QID) | ORAL | 2 refills | Status: AC | PRN

## 2020-09-20 MED ORDER — TETANUS-DIPHTH-ACELL PERTUSSIS 5-2.5-18.5 LF-MCG/0.5 IM SUSP: 0.5000 mL | Freq: Once | INTRAMUSCULAR | 0 refills | Status: AC

## 2020-09-20 NOTE — Progress Notes (Signed)
Provider: Marlowe Sax FNP-C  Sherrilyn Nairn, Nelda Bucks, NP  Patient Care Team: Jeris Easterly, Nelda Bucks, NP as PCP - General (Family Medicine)  Extended Emergency Contact Information Primary Emergency Contact: Elwin Sleight Mobile Phone: [phone] Relation: Other Secondary Emergency Contact: Milagros Evener Address: [address]          [place], [place] [address] Johnnette Litter of [place] Phone: [phone] Mobile Phone: [phone] Relation: Son  Code Status:  DNR Goals of care: Advanced Directive information Advanced Directives 09/20/2020  Does Patient Have a Medical Advance Directive? No  Would patient like information on creating a medical advance directive? No - Patient declined     Chief Complaint  Patient presents with  . Acute Visit    Follow up on nurse visit  for PAD Scrrening whic was abnormal. Patient would like to know where the blockage if any is and what should he do about it. Patient is complaning of a headache this am. he took 2 tylenol last night.   . Health Maintenance    Influenz, Tetanus, Colonoscopy    HPI:  Pt is a 74 y.o. male seen today for an acute visit for follow up PAD screening results done by [place] [facility] 8/5/2021results indicated left moderate 0.79 abnormal and right was normal 1.06. He denies any pain,numbness,tingling or ulcer on legs. He states smokes one cigarette per day has cut down sometimes does not even smoke. Has not been taking his Lipitor states did not get it.  He tries to include vegetables in his diet.  He is due for Influenza vaccine and Tdap vaccine. Also due for colonoscopy.states had one over 10 yrs states results were not good and it was not bad. He denies any blood or dark stool.   Past Medical History:  Diagnosis Date  . Alcoholic hepatitis   . Anxiety associated with depression   . Blurred vision   . Gait disturbance   . High blood pressure    Per [facility] Patient Packet   . High cholesterol     Per [facility] New  Patient Packet   . History of brain surgery   . Lumbar radiculopathy   . Lumbar radiculopathy   . Nicotine addiction   . Pain in leg, unspecified   . Type 2 diabetes mellitus ([place])   . Vitamin D deficiency    History reviewed. No pertinent surgical history.  Allergies  Allergen Reactions  . Penicillins     Outpatient Encounter Medications as of 09/20/2020  Medication Sig  . acetaminophen (TYLENOL) 500 MG tablet Take 1 tablet (500 mg total) by mouth every 6 (six) hours as needed.  Marland Kitchen amLODipine (NORVASC) 5 MG tablet Take 1 tablet (5 mg total) by mouth daily.  . mirtazapine (REMERON) 7.5 MG tablet Take 1 tablet (7.5 mg total) by mouth at bedtime.  . sertraline (ZOLOFT) 25 MG tablet Take 1 tablet (25 mg total) by mouth daily.  . vitamin B-12 (CYANOCOBALAMIN) 1000 MCG tablet Take 1 tablet (1,000 mcg total) by mouth daily.  . [DISCONTINUED] acetaminophen (TYLENOL) 500 MG tablet Take 1 tablet (500 mg total) by mouth every 6 (six) hours as needed.  . [DISCONTINUED] acetaminophen (TYLENOL) 500 MG tablet Take 500 mg by mouth every 6 (six) hours as needed.  Marland Kitchen atorvastatin (LIPITOR) 10 MG tablet Take 1 tablet (10 mg total) by mouth daily.  . [DISCONTINUED] atorvastatin (LIPITOR) 10 MG tablet Take 1 tablet (10 mg total) by mouth daily.   No facility-administered encounter medications on file as  of 09/20/2020.    Review of Systems  Constitutional: Negative for appetite change, chills, fatigue and fever.  HENT: Negative for congestion, rhinorrhea, sinus pressure, sinus pain, sneezing and sore throat.   Eyes: Negative for discharge, redness and itching.  Respiratory: Negative for cough, chest tightness, shortness of breath and wheezing.   Cardiovascular: Negative for chest pain, palpitations and leg swelling.  Gastrointestinal: Negative for abdominal distention, abdominal pain, constipation, diarrhea, nausea and vomiting.  Endocrine: Negative for cold intolerance, heat intolerance, polydipsia,  polyphagia and polyuria.  Genitourinary: Negative for difficulty urinating, dysuria, flank pain, frequency and urgency.  Musculoskeletal: Positive for arthralgias and gait problem. Negative for joint swelling and myalgias.  Skin: Negative for color change, pallor and rash.  Neurological: Negative for dizziness, speech difficulty, weakness, light-headedness, numbness and headaches.  Hematological: Does not bruise/bleed easily.  Psychiatric/Behavioral: Negative for agitation, behavioral problems and sleep disturbance. The patient is not nervous/anxious.     Immunization History  Administered Date(s) Administered  . Pneumococcal Conjugate-13 07/02/2009   Pertinent  Health Maintenance Due  Topic Date Due  . COLONOSCOPY  Never done  . PNA vac Low Risk Adult (2 of 2 - PPSV23) 04/16/2012  . INFLUENZA VACCINE  Never done   Fall Risk  09/20/2020 07/05/2020 06/25/2020 06/11/2020  Falls in the past year? 0 0 0 1  Number falls in past yr: 0 0 0 0  Injury with Fall? 0 0 0 0   Functional Status Survey:    Vitals:   09/20/20 1013  BP: 140/82  Pulse: 74  Temp: 97.7 F (36.5 C)  SpO2: 97%  Weight: 158 lb (71.7 kg)  Height: 5\' 8"  (1.727 m)   Body mass index is 24.02 kg/m. Physical Exam Vitals reviewed.  Constitutional:      General: He is not in acute distress.    Appearance: He is not ill-appearing.  HENT:     Head: Normocephalic.     Nose: Nose normal. No congestion or rhinorrhea.     Mouth/Throat:     Mouth: Mucous membranes are moist.     Pharynx: Oropharynx is clear. No oropharyngeal exudate or posterior oropharyngeal erythema.  Eyes:     General: No scleral icterus.       Right eye: No discharge.        Left eye: No discharge.     Conjunctiva/sclera: Conjunctivae normal.     Pupils: Pupils are equal, round, and reactive to light.  Cardiovascular:     Rate and Rhythm: Normal rate and regular rhythm.     Pulses: Normal pulses.     Heart sounds: Normal heart sounds. No murmur  heard.  No friction rub. No gallop.   Pulmonary:     Effort: Pulmonary effort is normal. No respiratory distress.     Breath sounds: Normal breath sounds. No wheezing, rhonchi or rales.  Chest:     Chest wall: No tenderness.  Abdominal:     General: Bowel sounds are normal. There is no distension.     Palpations: Abdomen is soft. There is no mass.     Tenderness: There is no abdominal tenderness. There is no right CVA tenderness, left CVA tenderness, guarding or rebound.  Musculoskeletal:        General: No swelling or tenderness.     Cervical back: Normal range of motion. No rigidity or tenderness.     Right lower leg: No edema.     Left lower leg: No edema.     Comments: Unsteady  gait   Lymphadenopathy:     Cervical: No cervical adenopathy.  Skin:    General: Skin is warm and dry.     Coloration: Skin is not jaundiced or pale.     Findings: No bruising, erythema, lesion or rash.     Comments: Bilateral lower extremities dry skin dermatitis itching.   Neurological:     Mental Status: He is alert. Mental status is at baseline.     Cranial Nerves: No cranial nerve deficit.     Sensory: No sensory deficit.     Motor: No weakness.     Coordination: Coordination normal.     Gait: Gait abnormal.  Psychiatric:        Mood and Affect: Mood normal.        Behavior: Behavior normal.        Thought Content: Thought content normal.        Judgment: Judgment normal.    Labs reviewed: Recent Labs    06/11/20 1549  NA 142  K 4.1  CL 104  CO2 25  GLUCOSE 103*  BUN 15  CREATININE 1.47*  CALCIUM 10.0   Recent Labs    06/11/20 1549  AST 27  ALT 45  BILITOT 0.7  PROT 7.4   Recent Labs    06/11/20 1549  WBC 7.4  NEUTROABS 4,551  HGB 17.1  HCT 51.1*  MCV 91.4  PLT 176   Lab Results  Component Value Date   TSH 1.78 07/01/2020   Lab Results  Component Value Date   HGBA1C 5.2 07/01/2020   Lab Results  Component Value Date   CHOL 228 (H) 07/01/2020   HDL 51  07/01/2020   LDLCALC 157 (H) 07/01/2020   TRIG 96 07/01/2020   CHOLHDL 4.5 07/01/2020    Significant Diagnostic Results in last 30 days:  No results found.  Assessment/Plan 1. Hyperlipidemia LDL goal <100 Latest LDL not at goal.Has not been taking his atorvastatin as directed. Advised to take atorvastatin with evening meal.  - atorvastatin (LIPITOR) 10 MG tablet; Take 1 tablet (10 mg total) by mouth daily.  Dispense: 90 tablet; Refill: 0  2. Need for influenza vaccination Afebrile.No symptoms of URI's.  - Flu Vaccine QUAD High Dose(Fluad)  3. Peripheral arterial disease ([place]) Recent ABI's done by [facility] provider indicated left moderate 0.79 abnormal and right was normal 1.06. - continue on Atorvastatin as above. - smoking cessation advised   4. Tobacco use disorder, continuous - smoking cessation advised will try the nicotine patch.Advised not to smoking when wearing the patch verbalized understanding.  - nicotine (NICODERM CQ) 7 mg/24hr patch; Place 1 patch (7 mg total) onto the skin daily.  Dispense: 28 patch; Refill: 0  5. Need for Tdap vaccination Advised to get Tdap vaccine at his pharmacy. - Tdap (BOOSTRIX) 5-2.5-18.5 LF-MCG/0.5 injection; Inject 0.5 mLs into the muscle once for 1 dose.  Dispense: 0.5 mL; Refill: 0  6. Colon cancer screening Previous colonoscopy > 10 yrs ago.No results no review in chart.will refer to Gastroenterology.  - Ambulatory referral to Gastroenterology  7. Dermatitis Bilateral lower extremities.Has used hydrocortisone in the past with much relief.  - hydrocortisone 1 % ointment; Apply 1 application topically 2 (two) times daily.  Dispense: 30 g; Refill: 1  Family/ staff Communication: Reviewed plan of care with patient  Labs/tests ordered: None   Next Appointment:Has appointment 01/17/2021    Sandrea Hughs, NP

## 2020-09-30 ENCOUNTER — Other Ambulatory Visit: Payer: Medicare Other

## 2020-09-30 DIAGNOSIS — E785 Hyperlipidemia, unspecified: Secondary | ICD-10-CM

## 2020-10-13 ENCOUNTER — Other Ambulatory Visit: Payer: Self-pay | Admitting: Family

## 2020-10-13 DIAGNOSIS — F418 Other specified anxiety disorders: Secondary | ICD-10-CM

## 2020-10-14 NOTE — Telephone Encounter (Signed)
Alert for sertraline and mirtazapine

## 2020-10-29 ENCOUNTER — Encounter: Payer: Self-pay | Admitting: Gastroenterology

## 2020-12-04 IMAGING — CR DG HIP (WITH OR WITHOUT PELVIS) 2-3V*L*
2 series · 2 of 2 positions shown · non-contrast
Comparison: None.

CLINICAL DATA: Chronic left hip pain

EXAM:
DG HIP  2-3V LEFT

[w hip ap left]
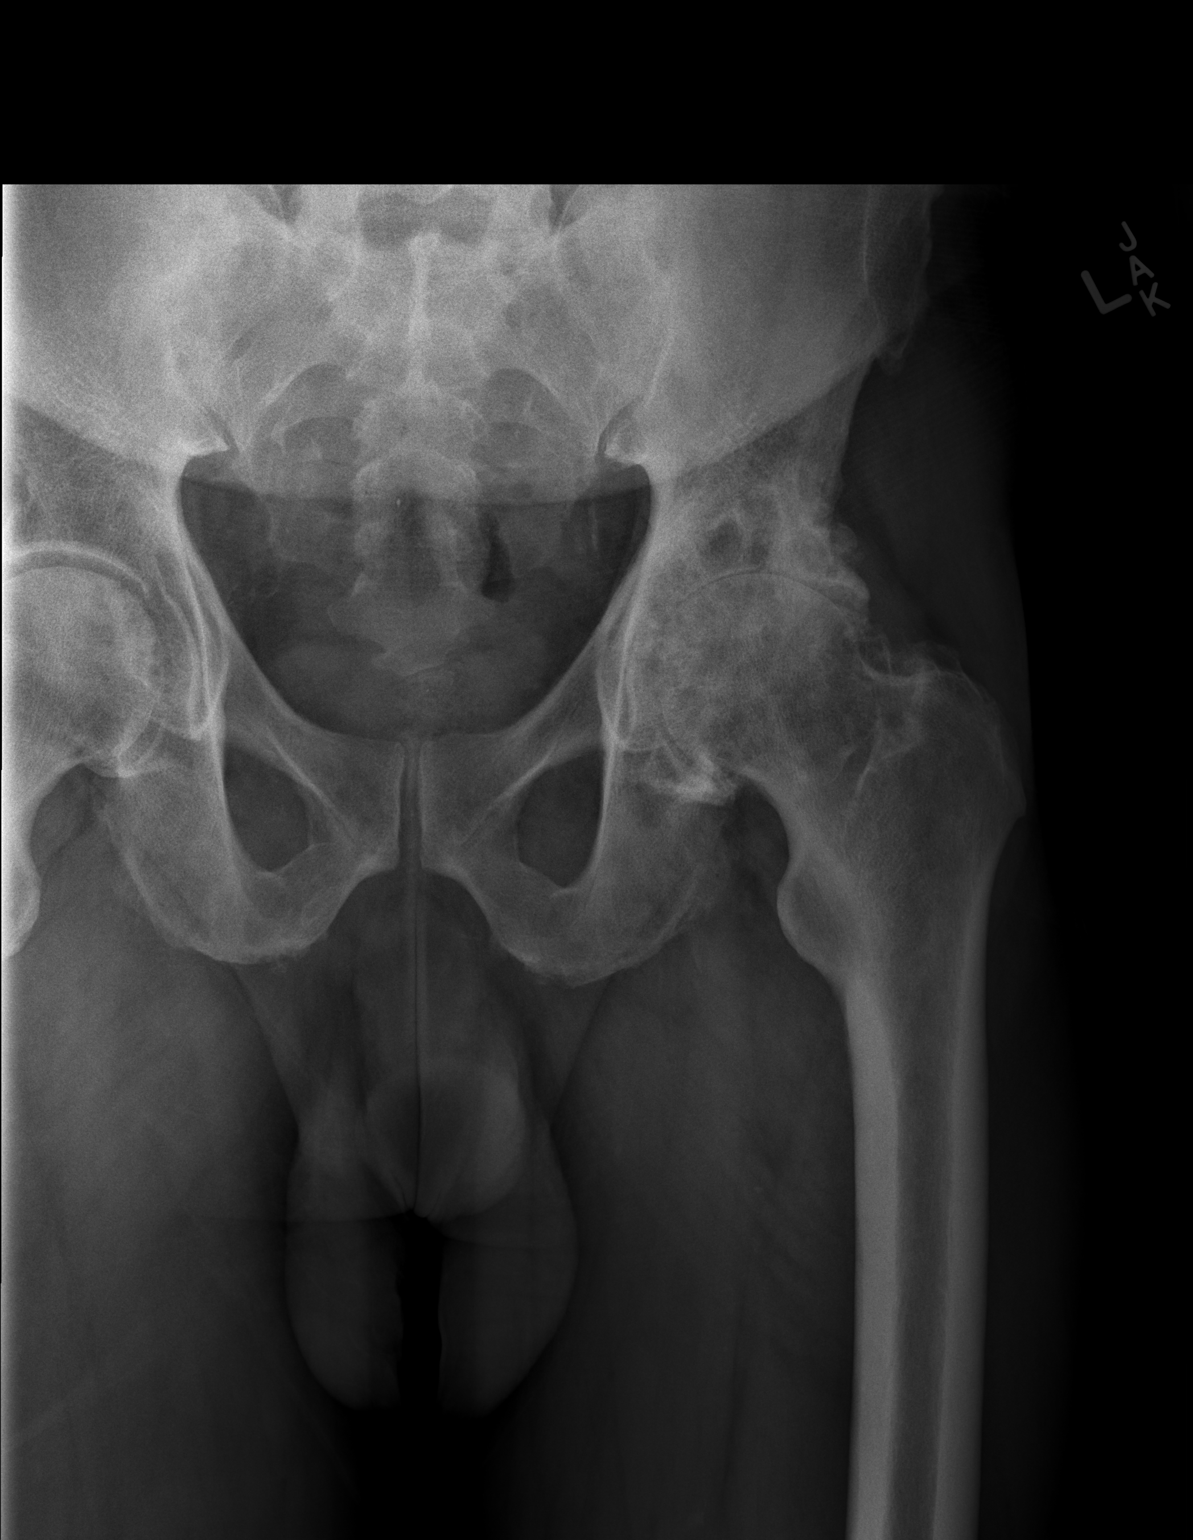

[w hip lat left]
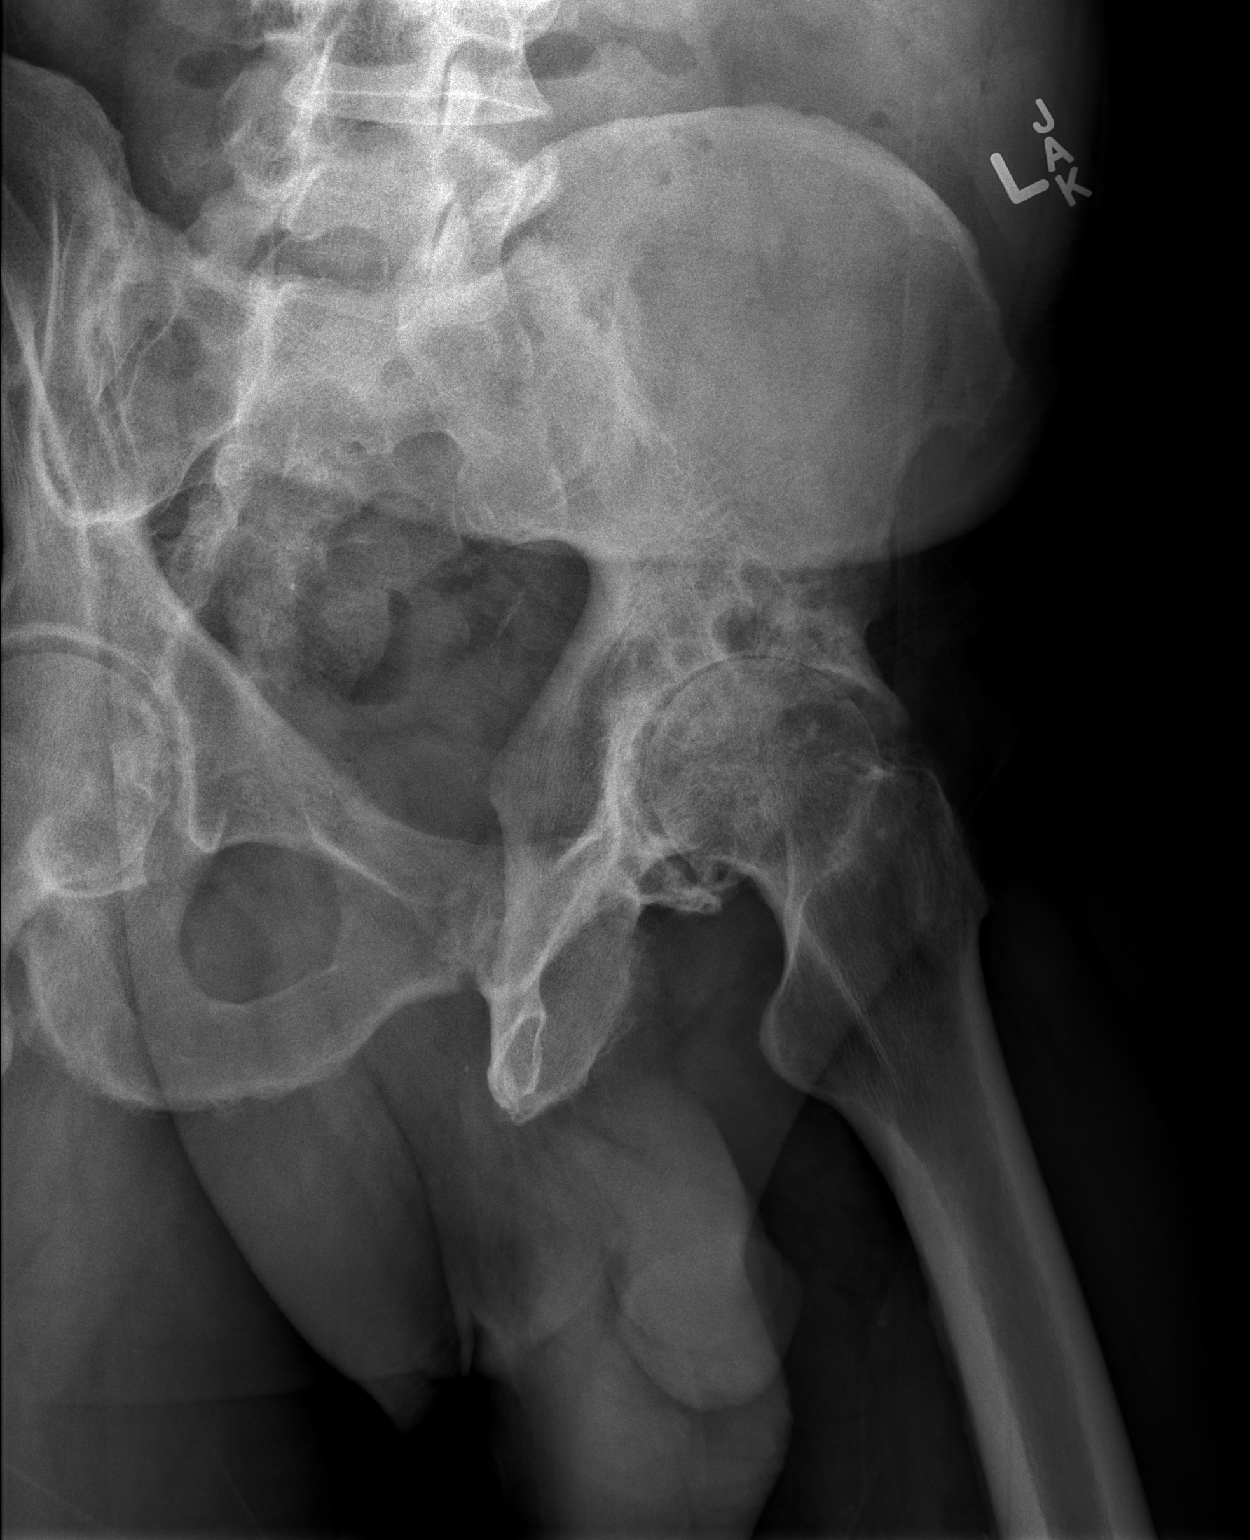

[2 of 2 positions shown; findings below may reference images not displayed]

FINDINGS: Standing frontal and lateral views were obtained. No acute fracture
or dislocation. There is severe narrowing of the left hip joint with
bony overgrowth and subchondral cystic change consistent with
osteoarthritis. There is milder narrowing of the right hip joint. No
erosive change. Sacroiliac joints bilaterally appear normal.
IMPRESSION: Extensive osteoarthritic change in the left hip joint with severe
narrowing, subchondral cystic change, and diffuse bony overgrowth.
Milder narrowing right hip joint. No fracture or dislocation.

## 2020-12-05 ENCOUNTER — Ambulatory Visit (AMBULATORY_SURGERY_CENTER): Payer: Self-pay | Admitting: *Deleted

## 2020-12-05 ENCOUNTER — Other Ambulatory Visit: Payer: Self-pay

## 2020-12-05 VITALS — Ht 68.0 in | Wt 149.0 lb

## 2020-12-05 DIAGNOSIS — R6889 Other general symptoms and signs: Secondary | ICD-10-CM | POA: Diagnosis not present

## 2020-12-05 DIAGNOSIS — Z1211 Encounter for screening for malignant neoplasm of colon: Secondary | ICD-10-CM

## 2020-12-05 MED ORDER — SUPREP BOWEL PREP KIT 17.5-3.13-1.6 GM/177ML PO SOLN: 1.0000 | Freq: Once | ORAL | 0 refills | Status: AC

## 2020-12-05 NOTE — Progress Notes (Signed)

## 2020-12-18 ENCOUNTER — Other Ambulatory Visit: Payer: Self-pay | Admitting: Family

## 2020-12-18 DIAGNOSIS — E785 Hyperlipidemia, unspecified: Secondary | ICD-10-CM

## 2020-12-20 ENCOUNTER — Encounter: Payer: Self-pay | Admitting: Gastroenterology

## 2020-12-26 ENCOUNTER — Ambulatory Visit (AMBULATORY_SURGERY_CENTER): Payer: Medicare Other | Admitting: Gastroenterology

## 2020-12-26 ENCOUNTER — Other Ambulatory Visit: Payer: Self-pay

## 2020-12-26 ENCOUNTER — Encounter: Payer: Self-pay | Admitting: Gastroenterology

## 2020-12-26 VITALS — BP 128/80 | HR 78 | Temp 97.3°F | Resp 17 | Ht 68.0 in | Wt 149.0 lb

## 2020-12-26 DIAGNOSIS — D125 Benign neoplasm of sigmoid colon: Secondary | ICD-10-CM

## 2020-12-26 DIAGNOSIS — D122 Benign neoplasm of ascending colon: Secondary | ICD-10-CM | POA: Diagnosis not present

## 2020-12-26 DIAGNOSIS — D123 Benign neoplasm of transverse colon: Secondary | ICD-10-CM | POA: Diagnosis not present

## 2020-12-26 DIAGNOSIS — Z1211 Encounter for screening for malignant neoplasm of colon: Secondary | ICD-10-CM

## 2020-12-26 DIAGNOSIS — D128 Benign neoplasm of rectum: Secondary | ICD-10-CM

## 2020-12-26 DIAGNOSIS — D120 Benign neoplasm of cecum: Secondary | ICD-10-CM

## 2020-12-26 MED ORDER — SODIUM CHLORIDE 0.9 % IV SOLN: 500.0000 mL | Freq: Once | INTRAVENOUS | Status: DC

## 2020-12-26 NOTE — Progress Notes (Signed)
Pt's states no medical or surgical changes since previsit or office visit. 

## 2020-12-26 NOTE — Op Note (Signed)
[facility] Patient Name: Alexander Pruitt Procedure Date: 12/26/2020 11:37 AM MRN: [id number] Endoscopist: Remo Lipps P. Havery Moros , MD Age: 75 Referring MD:  Date of Birth: 1946/01/04 Gender: Male Account #: [id number] Procedure:                Colonoscopy Indications:              Screening for colorectal malignant neoplasm Medicines:                Monitored Anesthesia Care Procedure:                Pre-Anesthesia Assessment:                           - Prior to the procedure, a History and Physical                            was performed, and patient medications and                            allergies were reviewed. The patient's tolerance of                            previous anesthesia was also reviewed. The risks                            and benefits of the procedure and the sedation                            options and risks were discussed with the patient.                            All questions were answered, and informed consent                            was obtained. Prior Anticoagulants: The patient has                            taken no previous anticoagulant or antiplatelet                            agents. ASA Grade Assessment: III - A patient with                            severe systemic disease. After reviewing the risks                            and benefits, the patient was deemed in                            satisfactory condition to undergo the procedure.                           After obtaining informed consent, the colonoscope  was passed under direct vision. Throughout the                            procedure, the patient's blood pressure, pulse, and                            oxygen saturations were monitored continuously. The                            Olympus CF-HQ190L ([id number]) Colonoscope was                            introduced through the anus and advanced to the the                            cecum,  identified by appendiceal orifice and                            ileocecal valve. The colonoscopy was performed                            without difficulty. The patient tolerated the                            procedure well. The quality of the bowel                            preparation was adequate throughout with exception                            of distal rectum due to retained vegetable matter.                            The ileocecal valve, appendiceal orifice, and                            rectum were photographed. Scope In: 12:03:12 PM Scope Out: 1:04:27 PM Scope Withdrawal Time: 0 hours 55 minutes 7 seconds  Total Procedure Duration: 1 hour 1 minute 15 seconds  Findings:                 The perianal and digital rectal examinations were                            normal.                           Two sessile polyps were found in the cecum. The                            polyps were diminutive in size. These polyps were                            removed with a cold snare. Resection and retrieval  were complete.                           A 5 mm polyp was found in the ascending colon. The                            polyp was sessile. The polyp was removed with a                            cold snare. Resection and retrieval were complete.                           A 10-12 mm polyp was found in the ascending colon.                            The polyp was multi-lobulated. It was located                            behind a fold on the proximal aspect of the hepatic                            flexure, difficult to visualize en bloc. The polyp                            was difficult to grasp due to positioning but was                            eventually able to be grasped with cold snare en                            bloc and micro tech snare cut through it but took                            some time to do so and complete polypectomy not                             achieved with the initial pass. Saline was injected                            at the base of the polyp site and it lifted well,                            no abnormal mucosal disruption from the initial                            pass, and margins of the residual polyp edges were                            removed with cold snare in piecemeal fashion.  Resection and retrieval were thought to be                            complete. There was some persistent oozing at the                            center of the polypectomy site, one hemostatic clip                            was successfully placed with good result.                           Two sessile polyps were found in the transverse                            colon. The polyps were 4 mm in size. These polyps                            were removed with a cold snare. Resection and                            retrieval were complete.                           Two pedunculated polyps were found in the sigmoid                            colon. The polyps were 5 to 6 mm in size. These                            polyps were removed with a hot snare. Resection was                            complete, but the polyp tissue was only partially                            retrieved of one polyp due to residual vegetable                            matter that entered the area after resection.                           A 8 mm polyp was found in the proximal rectum. The                            polyp was semi-pedunculated. The polyp was removed                            with a hot snare. Resection and retrieval were                            complete.  There was some residual vegetable matter in the                            rectum and distal sigmoid colon. Time was taken to                            lavage this and wash through it but given duration                            of  the exam from ascending polypectomy, need for                            close surveillance follow up, the rectum was not                            cleared and retroflexed views not obtained.                            Overall, entire colon well visualized with                            exception of the rectum. There appeared to be                            another smaller rectal polyp but attempts were not                            made to remove it given poor visualization in this                            area. The exam was otherwise without abnormality. Complications:            No immediate complications. Estimated blood loss:                            Minimal. Estimated Blood Loss:     Estimated blood loss was minimal. Impression:               - Two diminutive polyps in the cecum, removed with                            a cold snare. Resected and retrieved.                           - One 5 mm polyp in the ascending colon, removed                            with a cold snare. Resected and retrieved.                           - One 10-12 mm polyp in the ascending colon,  removed using injection-lift and a cold snare.                            Resected and retrieved. Tattooed. Clip was placed.                           - Two 4 mm polyps in the transverse colon, removed                            with a cold snare. Resected and retrieved.                           - Two 5 to 6 mm polyps in the sigmoid colon,                            removed with a hot snare. Complete resection.                            Partial retrieval.                           - One 8 mm polyp in the proximal rectum, removed                            with a hot snare. Resected and retrieved.                           - The examination was otherwise normal. Could not                            clear the rectum as above Recommendation:           - Patient has a contact number  available for                            emergencies. The signs and symptoms of potential                            delayed complications were discussed with the                            patient. Return to normal activities tomorrow.                            Written discharge instructions were provided to the                            patient.                           - Resume previous diet.                           - Continue present medications.                           -  Await pathology results.                           - Repeat colonoscopy in 3-6 months given difficult                            polypectomy as described and suspected rectal polyp                            that remains.                           - No ibuprofen, naproxen, or other non-steroidal                            anti-inflammatory drugs for 2 weeks after polyp                            removal. Remo Lipps P. Havery Moros, MD 12/26/2020 1:25:22 PM This report has been signed electronically.

## 2020-12-26 NOTE — Patient Instructions (Signed)
Handout given for polyps.  Clip card given for ascending colon polyp.  Carry in your wallet, clip should slough off in your stool in a few weeks.   No NSAIDS (Non-Steroidal anti-inflammatory drugs) for 2 weeks.  (These include, aspirin, aspirin-containing products, ibuprofen, advil, motrin, naproxen, aleve, goody powders, etc) Tylenol is ok to take as needed, see label for instructions.No NSAIDS (Non-Steroidal anti-inflammatory drugs) for 3 weeks.  (These include, aspirin, aspirin-containing products, ibuprofen, advil, motrin, naproxen, aleve, goody powders, etc) Tylenol is ok to take as needed, see label for instructions.    YOU HAD AN ENDOSCOPIC PROCEDURE TODAY AT [facility]:   Refer to the procedure report that was given to you for any specific questions about what was found during the examination.  If the procedure report does not answer your questions, please call your gastroenterologist to clarify.  If you requested that your care partner not be given the details of your procedure findings, then the procedure report has been included in a sealed envelope for you to review at your convenience later.  YOU SHOULD EXPECT: Some feelings of bloating in the abdomen. Passage of more gas than usual.  Walking can help get rid of the air that was put into your GI tract during the procedure and reduce the bloating. If you had a lower endoscopy (such as a colonoscopy or flexible sigmoidoscopy) you may notice spotting of blood in your stool or on the toilet paper. If you underwent a bowel prep for your procedure, you may not have a normal bowel movement for a few days.  Please Note:  You might notice some irritation and congestion in your nose or some drainage.  This is from the oxygen used during your procedure.  There is no need for concern and it should clear up in a day or so.  SYMPTOMS TO REPORT IMMEDIATELY:   Following lower endoscopy (colonoscopy or flexible  sigmoidoscopy):  Excessive amounts of blood in the stool  Significant tenderness or worsening of abdominal pains  Swelling of the abdomen that is new, acute  Fever of 100F or higher  For urgent or emergent issues, a gastroenterologist can be reached at any hour by calling [phone]. Do not use MyChart messaging for urgent concerns.    DIET:  We do recommend a small meal at first, but then you may proceed to your regular diet.  Drink plenty of fluids but you should avoid alcoholic beverages for 24 hours.  ACTIVITY:  You should plan to take it easy for the rest of today and you should NOT DRIVE or use heavy machinery until tomorrow (because of the sedation medicines used during the test).    FOLLOW UP: Our staff will call the number listed on your records 48-72 hours following your procedure to check on you and address any questions or concerns that you may have regarding the information given to you following your procedure. If we do not reach you, we will leave a message.  We will attempt to reach you two times.  During this call, we will ask if you have developed any symptoms of COVID 19. If you develop any symptoms (ie: fever, flu-like symptoms, shortness of breath, cough etc.) before then, please call [phone].  If you test positive for Covid 19 in the 2 weeks post procedure, please call and report this information to [place].    If any biopsies were taken you will be contacted by phone or by letter within the next 1-3  weeks.  Please call us at [phone] if you have not heard about the biopsies in 3 weeks.    SIGNATURES/CONFIDENTIALITY: You and/or your care partner have signed paperwork which will be entered into your electronic medical record.  These signatures attest to the fact that that the information above on your After Visit Summary has been reviewed and is understood.  Full responsibility of the confidentiality of this discharge information lies with you and/or your  care-partner.

## 2020-12-26 NOTE — Progress Notes (Signed)
pt tolerated well. VSS. awake and to recovery. Report given to RN.  

## 2020-12-30 ENCOUNTER — Telehealth: Payer: Self-pay

## 2020-12-30 NOTE — Telephone Encounter (Signed)
Attempted to reach pt. With follow-up call following endoscopic procedure 12/26/2020.  LM on pt. Voice mail.  Will try to reach pt. Again later today.

## 2020-12-30 NOTE — Telephone Encounter (Signed)
  Follow up Call-  Call back number 12/26/2020  Post procedure Call Back phone  # [phone]  Permission to leave phone message Yes     Patient questions:  Do you have a fever, pain , or abdominal swelling? No. Pain Score  0 *  Have you tolerated food without any problems? Yes.    Have you been able to return to your normal activities? Yes.    Do you have any questions about your discharge instructions: Diet   No. Medications  No. Follow up visit  No.  Do you have questions or concerns about your Care? No.  Actions: * If pain score is 4 or above: No action needed, pain <4.  1. Have you developed a fever since your procedure? no  2.   Have you had an respiratory symptoms (SOB or cough) since your procedure? no  3.   Have you tested positive for COVID 19 since your procedure no  4.   Have you had any family members/close contacts diagnosed with the COVID 19 since your procedure?  no   If yes to any of these questions please route to Joylene John, RN and Joella Prince, RN

## 2021-01-07 ENCOUNTER — Other Ambulatory Visit: Payer: Self-pay | Admitting: Family

## 2021-01-07 DIAGNOSIS — G4700 Insomnia, unspecified: Secondary | ICD-10-CM

## 2021-01-07 DIAGNOSIS — R630 Anorexia: Secondary | ICD-10-CM

## 2021-01-07 NOTE — Telephone Encounter (Signed)
Patient has request a refill on medication "Mirtazapine 7.5mg " Medication last refill was 07/05/2020 with 90 tablets to be taken daily at bedtime and 1 additional refill. Patient is due for refill. I tried to send medication in but warning comes up. Medication pend and sent to PCP Ngetich, Nelda Bucks, NP for approval. Please Advise.

## 2021-01-08 ENCOUNTER — Telehealth: Payer: Self-pay | Admitting: Gastroenterology

## 2021-01-08 NOTE — Telephone Encounter (Signed)
Inbound call from patient's son returning your call.

## 2021-01-17 ENCOUNTER — Ambulatory Visit: Payer: Medicare Other | Admitting: Family

## 2021-01-27 ENCOUNTER — Encounter: Payer: Self-pay | Admitting: Family

## 2021-01-27 ENCOUNTER — Other Ambulatory Visit: Payer: Self-pay

## 2021-01-27 ENCOUNTER — Ambulatory Visit (INDEPENDENT_AMBULATORY_CARE_PROVIDER_SITE_OTHER): Payer: Medicare Other | Admitting: Family

## 2021-01-27 VITALS — BP 140/80 | HR 65 | Temp 97.7°F | Resp 16 | Ht 68.0 in | Wt 156.0 lb

## 2021-01-27 DIAGNOSIS — I10 Essential (primary) hypertension: Secondary | ICD-10-CM

## 2021-01-27 DIAGNOSIS — E785 Hyperlipidemia, unspecified: Secondary | ICD-10-CM | POA: Diagnosis not present

## 2021-01-27 DIAGNOSIS — R6889 Other general symptoms and signs: Secondary | ICD-10-CM | POA: Diagnosis not present

## 2021-01-27 DIAGNOSIS — G4700 Insomnia, unspecified: Secondary | ICD-10-CM | POA: Diagnosis not present

## 2021-01-27 DIAGNOSIS — F17209 Nicotine dependence, unspecified, with unspecified nicotine-induced disorders: Secondary | ICD-10-CM

## 2021-01-27 DIAGNOSIS — H43393 Other vitreous opacities, bilateral: Secondary | ICD-10-CM | POA: Diagnosis not present

## 2021-01-27 DIAGNOSIS — F418 Other specified anxiety disorders: Secondary | ICD-10-CM

## 2021-01-27 DIAGNOSIS — R7303 Prediabetes: Secondary | ICD-10-CM | POA: Diagnosis not present

## 2021-01-27 DIAGNOSIS — R7309 Other abnormal glucose: Secondary | ICD-10-CM | POA: Diagnosis not present

## 2021-01-27 DIAGNOSIS — R630 Anorexia: Secondary | ICD-10-CM

## 2021-01-27 DIAGNOSIS — Z23 Encounter for immunization: Secondary | ICD-10-CM

## 2021-01-27 MED ORDER — AMLODIPINE BESYLATE 5 MG PO TABS: 5.0000 mg | ORAL_TABLET | Freq: Every day | ORAL | 3 refills | Status: DC

## 2021-01-27 MED ORDER — ATORVASTATIN CALCIUM 10 MG PO TABS: ORAL_TABLET | ORAL | 1 refills | Status: DC

## 2021-01-27 MED ORDER — NICOTINE 14 MG/24HR TD PT24: MEDICATED_PATCH | TRANSDERMAL | 1 refills | Status: DC

## 2021-01-27 MED ORDER — TETANUS-DIPHTH-ACELL PERTUSSIS 5-2.5-18.5 LF-MCG/0.5 IM SUSP: 0.5000 mL | Freq: Once | INTRAMUSCULAR | 0 refills | Status: AC

## 2021-01-27 MED ORDER — SERTRALINE HCL 25 MG PO TABS: ORAL_TABLET | ORAL | 1 refills | Status: DC

## 2021-01-27 MED ORDER — MIRTAZAPINE 7.5 MG PO TABS: ORAL_TABLET | ORAL | 1 refills | Status: DC

## 2021-01-27 NOTE — Patient Instructions (Signed)
Nicotine patch Apply 14 mg Patch topically daily x 6 weeks then  Apply 7 mg patch Topically daily x 2 weeks then stop Avoid smoking while on patch

## 2021-01-30 LAB — CBC WITH DIFFERENTIAL/PLATELET
Absolute Monocytes: 719 cells/uL (ref 200–950)
Basophils Absolute: 32 cells/uL (ref 0–200)
Basophils Relative: 0.4 %
Eosinophils Absolute: 32 cells/uL (ref 15–500)
Eosinophils Relative: 0.4 %
HCT: 45.6 % (ref 38.5–50.0)
Hemoglobin: 14.7 g/dL (ref 13.2–17.1)
Lymphs Abs: 2283 cells/uL (ref 850–3900)
MCH: 28.1 pg (ref 27.0–33.0)
MCHC: 32.2 g/dL (ref 32.0–36.0)
MCV: 87 fL (ref 80.0–100.0)
MPV: 11.1 fL (ref 7.5–12.5)
Monocytes Relative: 9.1 %
Neutro Abs: 4835 cells/uL (ref 1500–7800)
Neutrophils Relative %: 61.2 %
Platelets: 195 10*3/uL (ref 140–400)
RBC: 5.24 10*6/uL (ref 4.20–5.80)
RDW: 12.8 % (ref 11.0–15.0)
Total Lymphocyte: 28.9 %
WBC: 7.9 10*3/uL (ref 3.8–10.8)

## 2021-01-30 LAB — COMPLETE METABOLIC PANEL WITH GFR
AG Ratio: 1.6 (calc) (ref 1.0–2.5)
ALT: 15 U/L (ref 9–46)
AST: 17 U/L (ref 10–35)
Albumin: 4.5 g/dL (ref 3.6–5.1)
Alkaline phosphatase (APISO): 124 U/L (ref 35–144)
BUN/Creatinine Ratio: 11 (calc) (ref 6–22)
BUN: 17 mg/dL (ref 7–25)
CO2: 25 mmol/L (ref 20–32)
Calcium: 8.9 mg/dL (ref 8.6–10.3)
Chloride: 106 mmol/L (ref 98–110)
Creat: 1.5 mg/dL — ABNORMAL HIGH (ref 0.70–1.18)
GFR, Est African American: 53 mL/min/{1.73_m2} — ABNORMAL LOW (ref >=60)
GFR, Est Non African American: 46 mL/min/{1.73_m2} — ABNORMAL LOW (ref >=60)
Globulin: 2.9 g/dL (calc) (ref 1.9–3.7)
Glucose, Bld: 102 mg/dL — ABNORMAL HIGH (ref 65–99)
Potassium: 4.4 mmol/L (ref 3.5–5.3)
Sodium: 142 mmol/L (ref 135–146)
Total Bilirubin: 0.4 mg/dL (ref 0.2–1.2)
Total Protein: 7.4 g/dL (ref 6.1–8.1)

## 2021-01-30 LAB — LIPID PANEL
Cholesterol: 152 mg/dL (ref <200)
HDL: 64 mg/dL (ref >=40)
LDL Cholesterol (Calc): 75 mg/dL (calc)
Non-HDL Cholesterol (Calc): 88 mg/dL (calc) (ref <130)
Total CHOL/HDL Ratio: 2.4 (calc) (ref <5.0)
Triglycerides: 45 mg/dL (ref <150)

## 2021-01-30 LAB — HEMOGLOBIN A1C
Hgb A1c MFr Bld: 5.3 % of total Hgb (ref <5.7)
Mean Plasma Glucose: 105 mg/dL
eAG (mmol/L): 5.8 mmol/L

## 2021-01-30 LAB — TEST AUTHORIZATION

## 2021-01-30 LAB — TSH: TSH: 1.33 mIU/L (ref 0.40–4.50)

## 2021-02-02 NOTE — Progress Notes (Signed)
Provider: Marlowe Sax FNP-C   Crysten Kaman, Nelda Bucks, NP  Patient Care Team: Ranesha Val, Nelda Bucks, NP as PCP - General (Family Medicine)  Extended Emergency Contact Information Primary Emergency Contact: Elwin Sleight Mobile Phone: [phone] Relation: Other Secondary Emergency Contact: Milagros Evener Address: [address]          [place], [place] [address] Johnnette Litter of [place] Phone: [phone] Mobile Phone: [phone] Relation: Son  Code Status:  Full Code  Goals of care: Advanced Directive information Advanced Directives 01/27/2021  Does Patient Have a Medical Advance Directive? No  Would patient like information on creating a medical advance directive? No - Patient declined     Chief Complaint  Patient presents with   Medical Management of Chronic Issues    Follow up and medication refill.   Immunizations    Discuss the need for Tetanus Vaccine, Covid Vaccine, and PNA Vaccine.    HPI:  Pt is a 75 y.o. male seen today for medical management of chronic diseases.He states vision seems to be worsening sees some floaters worst with looking certain way or change position.No double vision.Need to see an eye doctor.  States had a colonoscopy done.chart review colonoscopy results done 12/26/2020 by Dr.Steven P. Armbuster indicates :  " - Two diminutive polyps in the cecum, removed with a cold snare. Resected and retrieved. - One 5 mm polyp in the ascending colon, removed with a cold snare. Resected and retrieved. - One 10-12 mm polyp in the ascending colon, removed using injection-lift and a cold snare. Resected and retrieved. Tattooed. Clip was placed. - Two 4 mm polyps in the transverse colon, removed with a cold snare. Resected and retrieved. - Two 5 to 6 mm polyps in the sigmoid colon, removed with a hot snare. Complete resection. Partial retrieval. - One 8 mm polyp in the proximal rectum, removed with a hot snare. Resected and retrieved. - The examination was  otherwise normal. Could not clear the rectum as above ".  He was advised to follow up in 3 months for a repeat colonoscopy.  Hyperlipidemia - on Lipitor 10 mg tablet daily.includes some veggies in his diet.No routine regular exercises.   PAD - He denies any swelling or ulcers on the legs. He was seen by [facility] Podiatrist for elongated toenails with dystrophic changes of hallux.Nail was debrided.No follow up appointment since he does not meet criteria for routine care since further debridement would not be covered by insurance.   Tobacco use -  States smokes < 10 cigarettes per day.discussed importance of smoking cessation would like medication to help him quit.   Tdap - script send to pharmacy on previous visit but did not get vaccine at his pharmacy.advised again today to get Tdap vaccine at his pharmacy script will be send today.   PNA vac - also due for Pneumococcal Pneumonia 23 will administer today.Had PNA 13 07/02/2009.    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 If you have any questions, ask  your nurse or doctor.  °  °  °  °STOP taking these medications   °nicotine 7 mg/24hr patch °Commonly known as: Nicoderm CQ °Replaced by: nicotine 14 mg/24hr patch °Stopped by:  C , NP °  °  °TAKE these medications   °acetaminophen 500 MG tablet °Commonly known as: TYLENOL °Take 1 tablet (500 mg total) by mouth every 6 (six) hours as needed. °  °amLODipine 5 MG tablet °Commonly known as: NORVASC °Take 1 tablet (5 mg total) by mouth daily. °  °atorvastatin 10 MG tablet °Commonly known as: LIPITOR °TAKE 1 TABLET(10 MG) BY MOUTH DAILY °  °hydrocortisone 1 % ointment °Apply 1 application topically 2 (two) times daily. °  °mirtazapine 7.5 MG tablet °Commonly known as: REMERON °TAKE 1 TABLET(7.5 MG) BY MOUTH AT BEDTIME °  °nicotine 14 mg/24hr patch °Commonly known as: Nicoderm CQ °Apply 14 mg Patch topically daily x 6 weeks then  °Apply 7 mg patch Topically daily x 2 weeks then stop °Avoid smoking while on patch °Replaces: nicotine 7 mg/24hr patch °Started by:  C , NP °  °sertraline 25 MG tablet °Commonly known as: ZOLOFT °TAKE 1 TABLET(25 MG) BY MOUTH DAILY °  °Tdap 5-2.5-18.5 LF-MCG/0.5 injection °Commonly known as: BOOSTRIX °Inject 0.5 mLs into the muscle once for 1 dose. °  °vitamin B-12 1000 MCG tablet °Commonly known as: CYANOCOBALAMIN °Take 1 tablet (1,000 mcg total) by mouth daily. °  °  ° ° °Review of Systems  °Constitutional: Negative for chills, fever, malaise/fatigue and weight loss.  °HENT: Negative for congestion, ear discharge, ear pain, hearing loss, sinus pain and sore throat.   °Eyes: Negative for double vision, pain, discharge and redness.  °     Floaters   °Respiratory: Negative for cough, sputum production, shortness of breath and wheezing.   °Cardiovascular: Negative for chest pain, palpitations, leg swelling and PND.  °Gastrointestinal: Negative for abdominal pain, blood in stool, constipation, diarrhea, heartburn, nausea and vomiting.  °Genitourinary: Negative for dysuria,  flank pain, frequency, hematuria and urgency.  °Musculoskeletal: Negative for back pain, falls, myalgias and neck pain.  °     Right hip chronic pain   °Skin: Negative for itching and rash.  °Neurological: Negative for dizziness, speech change, seizures, weakness and headaches.  °Psychiatric/Behavioral: Positive for memory loss. Negative for suicidal ideas. The patient does not have insomnia.   °     Depression anxiety stable  °Smokes 10 cigarettes    ° ° °Immunization History  °Administered Date(s) Administered  °• Fluad Quad(high Dose 65+) 09/20/2020  °• Moderna Sars-Covid-2 Vaccination 11/05/2020  °• Pneumococcal Conjugate-13 07/02/2009  °• Pneumococcal Polysaccharide-23 01/27/2021  ° °Pertinent  Health Maintenance Due  °Topic Date Due  °• COLONOSCOPY (Pts 45-49yrs Insurance coverage will need to be confirmed)  12/26/2030  °•   INFLUENZA VACCINE  Completed  °• PNA vac Low Risk Adult  Completed  ° °Fall Risk  01/27/2021 09/20/2020 07/05/2020 06/25/2020 06/11/2020  °Falls in the past year? 0 0 0 0 1  °Number falls in past yr: 0 0 0 0 0  °Injury with Fall? 0 0 0 0 0  ° °Functional Status Survey: °  ° °Vitals:  ° 01/27/21 1031  °BP: 140/80  °Pulse: 65  °Resp: 16  °Temp: 97.7 °F (36.5 °C)  °SpO2: 90%  °Weight: 156 lb (70.8 kg)  °Height: 5' 8" (1.727 m)  ° °Body mass index is 23.72 kg/m². °Physical Exam °Physical Exam °Vitals reviewed.  °Constitutional:   °   General: He is not in acute distress. °   Appearance: He is normal weight. He is not ill-appearing.  °HENT:  °   Head: Normocephalic.  °   Right Ear: Tympanic membrane, ear canal and external ear normal. There is no impacted cerumen.  °   Left Ear: Tympanic membrane, ear canal and external ear normal. There is no impacted cerumen.  °   Nose: Nose normal. No congestion or rhinorrhea.  °   Mouth/Throat:  °   Mouth: Mucous membranes are moist.  °   Pharynx: Oropharynx is clear. No oropharyngeal exudate or posterior oropharyngeal erythema.  °Eyes:  °   General: No scleral  icterus.    °   Right eye: No discharge.     °   Left eye: No discharge.  °   Extraocular Movements: Extraocular movements intact.  °   Conjunctiva/sclera: Conjunctivae normal.  °   Pupils: Pupils are equal, round, and reactive to light.  °Neck:  °   Vascular: No carotid bruit.  °Cardiovascular:  °   Rate and Rhythm: Normal rate and regular rhythm.  °   Pulses: Normal pulses.  °   Heart sounds: Normal heart sounds. No murmur heard. °No friction rub. No gallop.   °Pulmonary:  °   Effort: Pulmonary effort is normal. No respiratory distress.  °   Breath sounds: Normal breath sounds. No wheezing, rhonchi or rales.  °Chest:  °   Chest wall: No tenderness.  °Abdominal:  °   General: Bowel sounds are normal. There is no distension.  °   Palpations: Abdomen is soft. There is no mass.  °   Tenderness: There is no abdominal tenderness. There is no right CVA tenderness, left CVA tenderness, guarding or rebound.  °Musculoskeletal:     °   General: No swelling or tenderness.  °   Cervical back: Normal range of motion. No rigidity or tenderness.  °   Right lower leg: No edema.  °   Left lower leg: No edema.  °   Comments: Unsteady gait °Right hip tenderness  ° °  °Lymphadenopathy:  °   Cervical: No cervical adenopathy.  °Skin: °   General: Skin is warm and dry.  °   Coloration: Skin is not pale.  °   Findings: No bruising, erythema or rash.  °Neurological:  °   Mental Status: He is alert. Mental status is at baseline.  °   Cranial Nerves: No cranial nerve deficit.  °   Sensory: No sensory deficit.  °   Motor: No weakness.  °   Coordination: Coordination normal.  °   Gait: Gait abnormal.  °Psychiatric:     °   Mood and Affect: Mood normal.     °   Behavior: Behavior normal.     °     Thought Content: Thought content normal.     °   Judgment: Judgment normal.  ° ° °Labs reviewed: °Recent Labs  °  06/11/20 °1549 01/27/21 °0000  °NA 142 142  °K 4.1 4.4  °CL 104 106  °CO2 25 25  °GLUCOSE 103* 102*  °BUN 15 17  °CREATININE 1.47* 1.50*   °CALCIUM 10.0 8.9  ° °Recent Labs  °  06/11/20 °1549 01/27/21 °0000  °AST 27 17  °ALT 45 15  °BILITOT 0.7 0.4  °PROT 7.4 7.4  ° °Recent Labs  °  06/11/20 °1549 01/27/21 °0000  °WBC 7.4 7.9  °NEUTROABS 4,551 4,835  °HGB 17.1 14.7  °HCT 51.1* 45.6  °MCV 91.4 87.0  °PLT 176 195  ° °Lab Results  °Component Value Date  ° TSH 1.33 01/27/2021  ° °Lab Results  °Component Value Date  ° HGBA1C 5.3 01/27/2021  ° °Lab Results  °Component Value Date  ° CHOL 152 01/27/2021  ° HDL 64 01/27/2021  ° LDLCALC 75 01/27/2021  ° TRIG 45 01/27/2021  ° CHOLHDL 2.4 01/27/2021  ° ° °Significant Diagnostic Results in last 30 days:  °No results found. ° °Assessment/Plan °1. Hyperlipidemia LDL goal <100 °LDL at goal  °- continue on dietary modification and exercise  °- continue on atorvastatin  °- atorvastatin (LIPITOR) 10 MG tablet; TAKE 1 TABLET(10 MG) BY MOUTH DAILY  Dispense: 90 tablet; Refill: 1 °- Lipid Panel ° °2. Insomnia, unspecified type °Continue on mirtazapine  °- mirtazapine (REMERON) 7.5 MG tablet; TAKE 1 TABLET(7.5 MG) BY MOUTH AT BEDTIME  Dispense: 90 tablet; Refill: 1 ° °3. Decreased appetite °- has improved on mirtazapine °- Continue on mirtazapine  °- mirtazapine (REMERON) 7.5 MG tablet; TAKE 1 TABLET(7.5 MG) BY MOUTH AT BEDTIME  Dispense: 90 tablet; Refill: 1 ° °4. Depression with anxiety °Mood stable. °Continue on sertraline  °- sertraline (ZOLOFT) 25 MG tablet; TAKE 1 TABLET(25 MG) BY MOUTH DAILY  Dispense: 90 tablet; Refill: 1 °- TSH ° °5. Essential hypertension °B/p stable. °- continue on amlodipine  °- amLODipine (NORVASC) 5 MG tablet; Take 1 tablet (5 mg total) by mouth daily.  Dispense: 90 tablet; Refill: 3 °- Ambulatory referral to Ophthalmology for evaluation of floaters in both eyes.  °- CBC with Differential/Platelet °- CMP with eGFR([facility]) °- Lipid Panel °- TSH ° °6. Floaters in visual field, bilateral °Both eyes. °- Ambulatory referral to Ophthalmology ° °7. Need for Tdap vaccination °Advised to get Tdap  vaccine at his pharmacy. °- Tdap (BOOSTRIX) 5-2.5-18.5 LF-MCG/0.5 injection; Inject 0.5 mLs into the muscle once for 1 dose.  Dispense: 0.5 mL; Refill: 0 ° °8. Tobacco use disorder, continuous °Smoking cessation advised.would like to try nicotine patch.side effects discussed advised to avoid smoking while on the patch. °- nicotine (NICODERM CQ) 14 mg/24hr patch; Apply 14 mg Patch topically daily x 6 weeks then  °Apply 7 mg patch Topically daily x 2 weeks then stop °Avoid smoking while on patch  Dispense: 24 patch; Refill: 1 °- will follow up in 4 weeks then refill 7 mg patch if still needed to stop smoking.  ° °9. Need for pneumococcal vaccination °Afebrile. °Pneumococcal vaccine administered by [facility] no reaction reported.  °- Pneumococcal polysaccharide vaccine 23-valent greater than or equal to 2yo subcutaneous/IM ° °Family/ staff Communication: Reviewed plan of care with patient verbalized understanding.  ° °Labs/tests ordered: °- CBC with Differential/Platelet °- CMP with eGFR([facility]) °- Lipid Panel °- TSH ° °Next Appointment : 4 weeks for follow up smoking cessation  ° °  weeks for follow up smoking cessation   Sandrea Hughs, NP

## 2021-02-24 ENCOUNTER — Ambulatory Visit: Payer: Medicare Other | Admitting: Family

## 2021-02-25 ENCOUNTER — Encounter: Payer: Self-pay | Admitting: Family

## 2021-02-25 ENCOUNTER — Ambulatory Visit (INDEPENDENT_AMBULATORY_CARE_PROVIDER_SITE_OTHER): Payer: Medicare Other | Admitting: Family

## 2021-02-25 ENCOUNTER — Other Ambulatory Visit: Payer: Self-pay

## 2021-02-25 VITALS — BP 146/90 | HR 76 | Temp 97.5°F | Resp 16 | Ht 68.0 in | Wt 158.6 lb

## 2021-02-25 DIAGNOSIS — Z23 Encounter for immunization: Secondary | ICD-10-CM | POA: Diagnosis not present

## 2021-02-25 DIAGNOSIS — E785 Hyperlipidemia, unspecified: Secondary | ICD-10-CM

## 2021-02-25 DIAGNOSIS — F17209 Nicotine dependence, unspecified, with unspecified nicotine-induced disorders: Secondary | ICD-10-CM

## 2021-02-25 DIAGNOSIS — R7303 Prediabetes: Secondary | ICD-10-CM

## 2021-02-25 DIAGNOSIS — F418 Other specified anxiety disorders: Secondary | ICD-10-CM

## 2021-02-25 DIAGNOSIS — I10 Essential (primary) hypertension: Secondary | ICD-10-CM

## 2021-02-25 MED ORDER — TETANUS-DIPHTH-ACELL PERTUSSIS 5-2.5-18.5 LF-MCG/0.5 IM SUSP: 0.5000 mL | Freq: Once | INTRAMUSCULAR | 0 refills | Status: AC

## 2021-02-25 MED ORDER — NICOTINE 14 MG/24HR TD PT24: MEDICATED_PATCH | TRANSDERMAL | 1 refills | Status: DC

## 2021-02-25 NOTE — Patient Instructions (Addendum)
-Please get your Tdap vaccine at your pharmacy   Nicotine skin patches What is this medicine? NICOTINE ([place]) helps people stop smoking. The patches replace the nicotine found in cigarettes and help to decrease withdrawal effects. They are most effective when used in combination with a stop-smoking program. This medicine may be used for other purposes; ask your health care provider or pharmacist if you have questions. COMMON BRAND NAME(S): Habitrol, Nicoderm CQ, Nicotrol What should I tell my health care provider before I take this medicine? They need to know if you have any of these conditions:  diabetes  heart disease, angina, irregular heartbeat or previous heart attack  high blood pressure  lung disease, including asthma  overactive thyroid  pheochromocytoma  seizures or a history of seizures  skin problems, like eczema  stomach problems or ulcers  an unusual or allergic reaction to nicotine, adhesives, other medicines, foods, dyes, or preservatives  pregnant or trying to get pregnant  breast-feeding How should I use this medicine? This medicine is for external use only. Follow the directions on the label. Do not cut or trim the patch. After applying the patch, wash your hands. Change the patch every day in the morning, keeping to a regular schedule. When you apply a new patch, use a new area of skin. Wait at least 1 week before using the same area again. This drug comes with INSTRUCTIONS FOR USE. Ask your pharmacist for directions on how to use this drug. Read the information carefully. Talk to your pharmacist or health care provider if you have questions. Talk to your pediatrician regarding the use of this medicine in children. Special care may be needed. Overdosage: If you think you have taken too much of this medicine contact a poison control center or emergency room at once. NOTE: This medicine is only for you. Do not share this medicine with others. What if I  miss a dose? If you forget to replace a patch, use it as soon as you can. Only use one patch at a time and do not leave on the skin for longer than directed. If a patch falls off, you can replace it, but keep to your schedule and remove the patch at the right time. What may interact with this medicine?  certain medicines for lung or breathing disease, like asthma  medicines for blood pressure  medicines for depression This list may not describe all possible interactions. Give your health care provider a list of all the medicines, herbs, non-prescription drugs, or dietary supplements you use. Also tell them if you smoke, drink alcohol, or use illegal drugs. Some items may interact with your medicine. What should I watch for while using this medicine? Visit your health care provider for regular checks on your progress. Talk to your health care provider about what you can do to improve your chances of quitting. If you are going to need surgery, an MRI, CT scan, or other procedure, tell your health care provider that you are using this drug. You may need to remove the patch before the procedure. What side effects may I notice from receiving this medicine? Side effects that you should report to your doctor or health care professional as soon as possible:  allergic reactions like skin rash, itching or hives, swelling of the face, lips, or tongue  breathing problems  changes in hearing  changes in vision  chest pain  cold sweats  confusion  fast, irregular heartbeat  feeling faint or lightheaded, falls  headache  increased saliva  skin redness that lasts more than 4 days  stomach pain  signs and symptoms of nicotine overdose like nausea; vomiting; dizziness; weakness; and rapid heartbeat Side effects that usually do not require medical attention (report to your doctor or health care professional if they continue or are bothersome):  diarrhea  dry  mouth  hiccups  irritability  nervousness or restlessness  trouble sleeping or vivid dreams This list may not describe all possible side effects. Call your doctor for medical advice about side effects. You may report side effects to FDA at 1-800-FDA-1088. Where should I keep my medicine? This product may have enough nicotine in it to make children and pets sick. Keep it away from children and pets. After using, throw away as directed on the package. Store at room temperature between 20 and 25 degrees C (68 and 77 degrees F). Protect from heat and light. Keep this medicine in the original container until ready to use. Throw away unused medicine after the expiration date. NOTE: This sheet is a summary. It may not cover all possible information. If you have questions about this medicine, talk to your doctor, pharmacist, or health care provider.  2021 Elsevier/Gold Standard (2019-11-02 16:54:39)

## 2021-02-25 NOTE — Progress Notes (Signed)
Provider: Marlowe Sax FNP-C  , Nelda Bucks, NP  Patient Care Team: , Nelda Bucks, NP as PCP - General (Family Medicine)  Extended Emergency Contact Information Primary Emergency Contact: Elwin Sleight Mobile Phone: [phone] Relation: Other Secondary Emergency Contact: Milagros Evener Address: [address]          [place], [place] [address] Johnnette Litter of [place] Phone: [phone] Mobile Phone: [phone] Relation: Son  Code Status:  Full Code  Goals of care: Advanced Directive information Advanced Directives 02/25/2021  Does Patient Have a Medical Advance Directive? No  Would patient like information on creating a medical advance directive? No - Patient declined     Chief Complaint  Patient presents with  . Medical Management of Chronic Issues    1 month follow up.  . Immunizations    Discuss the need for Tetanus Vaccine, and Covid Vaccine.    HPI:  Pt is a 75 y.o. male seen today for an acute visit for follow up smoking.He was here 01/27/2021 for medical management of his chronic issues.Nicotine patch was ordered for smoking cessation and faxed to pharmacy but states there was no script in the pharmacy.POA did not call to notify provider's office of script status.Tdap vaccine was also ordered but did not get vaccine in the pharmacy.Records indicates script was send to correct pharmacy.Both script will be resend to his pharmacy.Advised to notify provider if script not in pharmacy.  He is here with POA keeps arguing during visit with patient both are upset today towards each other. Patient states using cream ointment cream as required on lower extremities but POA states does not shower everyday and not applying cream.     Past Medical History:  Diagnosis Date  . Alcoholic hepatitis   . Anxiety associated with depression   . Blurred vision   . Gait disturbance   . High blood pressure    Per [facility] Packet   . High cholesterol     Per [facility] Packet   . History of brain surgery   . Lumbar radiculopathy   . Lumbar radiculopathy   . Nicotine addiction   . Pain in leg, unspecified   . Type 2 diabetes mellitus ([place])   . Vitamin D deficiency    Past Surgical History:  Procedure Laterality Date  . BRAIN SURGERY     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   . [place]     in [place] many yrs ago- he cannnot remember when or where     Allergies  Allergen Reactions  . Penicillins Other (See Comments)    Makes pt hurt all over     Outpatient Encounter Medications as of 02/25/2021  Medication Sig  . acetaminophen (TYLENOL) 500 MG tablet Take 1 tablet (500 mg total) by mouth every 6 (six) hours as needed.  Marland Kitchen amLODipine (NORVASC) 5 MG tablet Take 1 tablet (5 mg total) by mouth daily.  Marland Kitchen atorvastatin (LIPITOR) 10 MG tablet TAKE 1 TABLET(10 MG) BY MOUTH DAILY  . hydrocortisone 1 % ointment Apply 1 application topically 2 (two) times daily.  . mirtazapine (REMERON) 7.5 MG tablet TAKE 1 TABLET(7.5 MG) BY MOUTH AT BEDTIME  . nicotine (NICODERM CQ) 14 mg/24hr patch Apply 14 mg Patch topically daily x 6 weeks then  Apply 7 mg patch Topically daily x 2 weeks then stop Avoid smoking while on patch  . sertraline (ZOLOFT) 25 MG tablet TAKE 1 TABLET(25 MG) BY MOUTH DAILY  . vitamin  B-12 (CYANOCOBALAMIN) 1000 MCG tablet Take 1 tablet (1,000 mcg total) by mouth daily.   No facility-administered encounter medications on file as of 02/25/2021.    Review of Systems  Constitutional: Negative for appetite change, chills, fatigue and fever.  Respiratory: Negative for cough, chest tightness, shortness of breath and wheezing.   Cardiovascular: Negative for chest pain, palpitations and leg swelling.  Gastrointestinal: Negative for abdominal distention, abdominal pain, constipation, diarrhea, nausea and vomiting.  Skin: Negative for color change, pallor and rash.  Neurological: Negative for dizziness, speech difficulty, weakness,  light-headedness and headaches.  Psychiatric/Behavioral: Negative for agitation, behavioral problems, confusion, self-injury, sleep disturbance and suicidal ideas. The patient is not nervous/anxious.     Immunization History  Administered Date(s) Administered  . Fluad Quad(high Dose 65+) 09/20/2020  . Moderna Sars-Covid-2 Vaccination 11/05/2020  . Pneumococcal Conjugate-13 07/02/2009  . Pneumococcal Polysaccharide-23 01/27/2021   Pertinent  Health Maintenance Due  Topic Date Due  . COLONOSCOPY (Pts 45-66yr Insurance coverage will need to be confirmed)  12/26/2030  . INFLUENZA VACCINE  Completed  . PNA vac Low Risk Adult  Completed   Fall Risk  02/25/2021 01/27/2021 09/20/2020 07/05/2020 06/25/2020  Falls in the past year? 0 0 0 0 0  Number falls in past yr: 0 0 0 0 0  Injury with Fall? 0 0 0 0 0   Functional Status Survey:    Vitals:   02/25/21 0848  BP: (!) 146/90  Pulse: 76  Resp: 16  Temp: (!) 97.5 F (36.4 C)  SpO2: 91%  Weight: 158 lb 9.6 oz (71.9 kg)  Height: 5' 8" (1.727 m)   Body mass index is 24.12 kg/m. Physical Exam Vitals reviewed.  Constitutional:      General: He is not in acute distress.    Appearance: He is normal weight. He is not ill-appearing.  HENT:     Head: Normocephalic.  Eyes:     General: No scleral icterus.       Right eye: No discharge.        Left eye: No discharge.     Conjunctiva/sclera: Conjunctivae normal.     Pupils: Pupils are equal, round, and reactive to light.  Neck:     Vascular: No carotid bruit.  Cardiovascular:     Rate and Rhythm: Normal rate and regular rhythm.     Pulses: Normal pulses.     Heart sounds: Normal heart sounds. No murmur heard. No friction rub. No gallop.   Pulmonary:     Effort: Pulmonary effort is normal. No respiratory distress.     Breath sounds: Normal breath sounds. No wheezing, rhonchi or rales.  Chest:     Chest wall: No tenderness.  Abdominal:     General: Bowel sounds are normal. There is  no distension.     Palpations: Abdomen is soft. There is no mass.     Tenderness: There is no abdominal tenderness. There is no right CVA tenderness, left CVA tenderness, guarding or rebound.  Musculoskeletal:        General: No swelling or tenderness. Normal range of motion.     Cervical back: Normal range of motion. No rigidity or tenderness.     Right lower leg: No edema.     Left lower leg: No edema.  Lymphadenopathy:     Cervical: No cervical adenopathy.  Skin:    General: Skin is warm and dry.     Coloration: Skin is not pale.     Findings: No bruising, erythema  or rash.  Neurological:     Mental Status: He is alert and oriented to person, place, and time.     Cranial Nerves: No cranial nerve deficit.     Sensory: No sensory deficit.     Motor: No weakness.     Gait: Gait normal.  Psychiatric:        Mood and Affect: Affect is angry.        Behavior: Behavior normal.        Thought Content: Thought content normal.        Judgment: Judgment normal.     Comments: Arguing with POA during visit      Labs reviewed: Recent Labs    06/11/20 1549 01/27/21 0000  NA 142 142  K 4.1 4.4  CL 104 106  CO2 25 25  GLUCOSE 103* 102*  BUN 15 17  CREATININE 1.47* 1.50*  CALCIUM 10.0 8.9   Recent Labs    06/11/20 1549 01/27/21 0000  AST 27 17  ALT 45 15  BILITOT 0.7 0.4  PROT 7.4 7.4   Recent Labs    06/11/20 1549 01/27/21 0000  WBC 7.4 7.9  NEUTROABS 4,551 4,835  HGB 17.1 14.7  HCT 51.1* 45.6  MCV 91.4 87.0  PLT 176 195   Lab Results  Component Value Date   TSH 1.33 01/27/2021   Lab Results  Component Value Date   HGBA1C 5.3 01/27/2021   Lab Results  Component Value Date   CHOL 152 01/27/2021   HDL 64 01/27/2021   LDLCALC 75 01/27/2021   TRIG 45 01/27/2021   CHOLHDL 2.4 01/27/2021    Significant Diagnostic Results in last 30 days:  No results found.  Assessment/Plan  1. Tobacco use disorder, continuous Nicotine script previously send to  pharmacy but states script was not in pharmacy but didn't notify provider's office.will resend script.continues to smoke 3-7 cigarettes daily. Side effects discussed advised to avoid smoking while on the patch.Has used patched in the past while in the Hospital.  - nicotine (NICODERM CQ) 14 mg/24hr patch; Apply 14 mg Patch topically daily x 6 weeks then  Apply 7 mg patch Topically daily x 2 weeks then stop Avoid smoking while on patch  Dispense: 24 patch; Refill: 1  2. Need for Tdap vaccination Tdap script resend to pharmacy.  - Tdap (BOOSTRIX) 5-2.5-18.5 LF-MCG/0.5 injection; Inject 0.5 mLs into the muscle once for 1 dose.  Dispense: 0.5 mL; Refill: 0  3. Essential hypertension B/p stable, Continue on Amlodipine  - CBC with Differential/Platelet; Future - CMP with eGFR([facility]); Future - TSH; Future - Lipid panel; Future  4. Hyperlipidemia LDL goal <100 LDL at goal. Continue on atorvastatin  - Lipid panel; Future  5. Depression with anxiety Continue on sertraline 25 mg Tablet daily. - TSH; Future  6. Prediabetes Lab Results  Component Value Date   HGBA1C 5.3 01/27/2021   - Hemoglobin A1c; Future Continue on dietary modification and exercise. - on Statin    Family/ staff Communication: Reviewed plan of care with patient  Labs/tests ordered:  - CBC with Differential/Platelet; Future - CMP with eGFR([facility]); Future - TSH; Future - Lipid panel; Future - Hemoglobin A1c; Future  Next Appointment: 6 months for medical management of chronic issues.Fasting Labs prior to visit.    Sandrea Hughs, NP

## 2021-03-27 ENCOUNTER — Encounter: Payer: Self-pay | Admitting: Gastroenterology

## 2021-05-16 ENCOUNTER — Other Ambulatory Visit (HOSPITAL_COMMUNITY): Payer: Self-pay | Admitting: Urgent Care

## 2021-05-27 ENCOUNTER — Ambulatory Visit (HOSPITAL_BASED_OUTPATIENT_CLINIC_OR_DEPARTMENT_OTHER): Payer: Medicare Other

## 2021-06-13 ENCOUNTER — Ambulatory Visit (HOSPITAL_BASED_OUTPATIENT_CLINIC_OR_DEPARTMENT_OTHER): Payer: Self-pay

## 2021-06-13 ENCOUNTER — Encounter (HOSPITAL_BASED_OUTPATIENT_CLINIC_OR_DEPARTMENT_OTHER): Payer: Self-pay

## 2021-07-04 ENCOUNTER — Ambulatory Visit (HOSPITAL_COMMUNITY): Payer: Medicare Other | Attending: Urgent Care

## 2021-07-30 ENCOUNTER — Telehealth: Payer: Self-pay | Admitting: Family

## 2021-07-30 NOTE — Telephone Encounter (Signed)
I called patient and left a vm to calll the office and schedule an awv

## 2021-08-25 ENCOUNTER — Other Ambulatory Visit: Payer: Medicare Other

## 2021-08-28 ENCOUNTER — Ambulatory Visit (INDEPENDENT_AMBULATORY_CARE_PROVIDER_SITE_OTHER): Payer: Medicare Other | Admitting: Family

## 2021-08-28 ENCOUNTER — Encounter: Payer: Self-pay | Admitting: Family

## 2021-08-28 ENCOUNTER — Other Ambulatory Visit: Payer: Self-pay

## 2021-08-28 VITALS — BP 138/72 | HR 78 | Temp 97.3°F | Ht 68.0 in | Wt 161.4 lb

## 2021-08-28 DIAGNOSIS — G4700 Insomnia, unspecified: Secondary | ICD-10-CM

## 2021-08-28 DIAGNOSIS — M1612 Unilateral primary osteoarthritis, left hip: Secondary | ICD-10-CM

## 2021-08-28 DIAGNOSIS — R7303 Prediabetes: Secondary | ICD-10-CM

## 2021-08-28 DIAGNOSIS — E785 Hyperlipidemia, unspecified: Secondary | ICD-10-CM

## 2021-08-28 DIAGNOSIS — I10 Essential (primary) hypertension: Secondary | ICD-10-CM

## 2021-08-28 DIAGNOSIS — Z23 Encounter for immunization: Secondary | ICD-10-CM | POA: Diagnosis not present

## 2021-08-28 DIAGNOSIS — R630 Anorexia: Secondary | ICD-10-CM

## 2021-08-28 DIAGNOSIS — F17209 Nicotine dependence, unspecified, with unspecified nicotine-induced disorders: Secondary | ICD-10-CM

## 2021-08-28 DIAGNOSIS — I739 Peripheral vascular disease, unspecified: Secondary | ICD-10-CM | POA: Diagnosis not present

## 2021-08-28 DIAGNOSIS — L853 Xerosis cutis: Secondary | ICD-10-CM

## 2021-08-28 DIAGNOSIS — E538 Deficiency of other specified B group vitamins: Secondary | ICD-10-CM | POA: Diagnosis not present

## 2021-08-28 DIAGNOSIS — F418 Other specified anxiety disorders: Secondary | ICD-10-CM | POA: Diagnosis not present

## 2021-08-28 DIAGNOSIS — F1020 Alcohol dependence, uncomplicated: Secondary | ICD-10-CM

## 2021-08-28 DIAGNOSIS — R6889 Other general symptoms and signs: Secondary | ICD-10-CM | POA: Diagnosis not present

## 2021-08-28 MED ORDER — VITAMIN B-12 1000 MCG PO TABS: 1000.0000 ug | ORAL_TABLET | Freq: Every day | ORAL | 1 refills | Status: DC

## 2021-08-28 MED ORDER — ATORVASTATIN CALCIUM 10 MG PO TABS: ORAL_TABLET | ORAL | 1 refills | Status: DC

## 2021-08-28 MED ORDER — SERTRALINE HCL 25 MG PO TABS: ORAL_TABLET | ORAL | 1 refills | Status: DC

## 2021-08-28 MED ORDER — MIRTAZAPINE 7.5 MG PO TABS: ORAL_TABLET | ORAL | 1 refills | Status: DC

## 2021-08-28 MED ORDER — AMLODIPINE BESYLATE 5 MG PO TABS: 5.0000 mg | ORAL_TABLET | Freq: Every day | ORAL | 3 refills | Status: DC

## 2021-08-28 NOTE — Progress Notes (Signed)
Provider: Marlowe Sax FNP-C   Leeya Rusconi, Nelda Bucks, NP  Patient Care Team: Felice Hope, Nelda Bucks, NP as PCP - General (Family Medicine)  Extended Emergency Contact Information Primary Emergency Contact: Elwin Sleight Mobile Phone: [phone] Relation: Other Secondary Emergency Contact: Milagros Evener Address: [address]          [place], [place] [address] Johnnette Litter of [place] Phone: [phone] Mobile Phone: [phone] Relation: Son  Code Status:  Full Code  Goals of care: Advanced Directive information Advanced Directives 02/25/2021  Does Patient Have a Medical Advance Directive? No  Would patient like information on creating a medical advance directive? No - Patient declined     Chief Complaint  Patient presents with   Medical Management of Chronic Issues    Patient presents today for a 6 month follow-up.    HPI:  Pt is a 75 y.o. male seen today for 6 months for medical management of chronic diseases. Has a medical management of Hypertension,Hyperlipidemia,Type 2 DM,depression,Generalized anxiety disorder,Insomnia,Vitamin B 12 deficiency,Alcohol dependence,PAD,Tobacco use disorder,Mild Cognitive impairment,Osteoarthritis  of left hip among others.  States brother died 06-21-2021 10 Am at night on a Sunday " I will never forget it". Coping well.son has been taking him out at times. Has been walking every day one -two blocks to and forth.   Smokes cigarette 1-2 cigarette per day. Every now and then drinks one beer.  Drinks 1 gallon of water daily.  Tdap vaccine script send to pharmacy but did not go to get it states dependence on son's scheduled. Also due for Shingrix vaccine ,COVID-19 and influenza vaccine.   Past Medical History:  Diagnosis Date   Alcoholic hepatitis    Anxiety associated with depression    Blurred vision    Gait disturbance    High blood pressure    Per [facility] Patient Packet    High cholesterol     Per [facility] Packet     History of brain surgery    Lumbar radiculopathy    Lumbar radiculopathy    Nicotine addiction    Pain in leg, unspecified    Type 2 diabetes mellitus ([place])    Vitamin D deficiency    Past Surgical History:  Procedure Laterality Date   BRAIN SURGERY     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    [place]     in [place] many yrs ago- he cannnot remember when or where     Allergies  Allergen Reactions   Penicillins Other (See Comments)    Makes pt hurt all over     Allergies as of 08/28/2021       Reactions   Penicillins Other (See Comments)   Makes pt hurt all over         Medication List        Accurate as of August 28, 2021  9:11 AM. If you have any questions, ask your nurse or doctor.          acetaminophen 500 MG tablet Commonly known as: TYLENOL Take 1 tablet (500 mg total) by mouth every 6 (six) hours as needed.   amLODipine 5 MG tablet Commonly known as: NORVASC Take 1 tablet (5 mg total) by mouth daily.   atorvastatin 10 MG tablet Commonly known as: LIPITOR TAKE 1 TABLET(10 MG) BY MOUTH DAILY   hydrocortisone 1 % ointment Apply 1 application topically 2 (two) times daily.   mirtazapine 7.5 MG tablet Commonly known as: REMERON TAKE 1  TABLET(7.5 MG) BY MOUTH AT BEDTIME   nicotine 14 mg/24hr patch Commonly known as: Nicoderm CQ Apply 14 mg Patch topically daily x 6 weeks then  Apply 7 mg patch Topically daily x 2 weeks then stop Avoid smoking while on patch   sertraline 25 MG tablet Commonly known as: ZOLOFT TAKE 1 TABLET(25 MG) BY MOUTH DAILY   vitamin B-12 1000 MCG tablet Commonly known as: CYANOCOBALAMIN Take 1 tablet (1,000 mcg total) by mouth daily.        Review of Systems  Constitutional:  Negative for appetite change, chills, fatigue, fever and unexpected weight change.  HENT:  Negative for congestion, dental problem, ear discharge, ear pain, facial swelling, hearing loss, nosebleeds, postnasal drip, rhinorrhea, sinus  pressure, sinus pain, sneezing, sore throat, tinnitus and trouble swallowing.   Eyes:  Negative for pain, discharge, redness, itching and visual disturbance.  Respiratory:  Negative for cough, chest tightness, shortness of breath and wheezing.   Cardiovascular:  Negative for chest pain, palpitations and leg swelling.  Gastrointestinal:  Negative for abdominal distention, abdominal pain, blood in stool, constipation, diarrhea, nausea and vomiting.  Endocrine: Negative for cold intolerance, heat intolerance, polydipsia, polyphagia and polyuria.  Genitourinary:  Negative for difficulty urinating, dysuria, flank pain, frequency and urgency.  Musculoskeletal:  Positive for arthralgias and gait problem. Negative for back pain, joint swelling, myalgias, neck pain and neck stiffness.       Both knees   Skin:  Negative for color change, pallor, rash and wound.  Neurological:  Negative for dizziness, syncope, speech difficulty, weakness, light-headedness, numbness and headaches.  Hematological:  Does not bruise/bleed easily.  Psychiatric/Behavioral:  Positive for sleep disturbance. Negative for agitation, behavioral problems, confusion, hallucinations, self-injury and suicidal ideas. The patient is not nervous/anxious.        Memory lapse   Immunization History  Administered Date(s) Administered   Fluad Quad(high Dose 65+) 09/20/2020   Moderna Sars-Covid-2 Vaccination 11/05/2020   Pneumococcal Conjugate-13 07/02/2009   Pneumococcal Polysaccharide-23 01/27/2021   Pertinent  Health Maintenance Due  Topic Date Due   INFLUENZA VACCINE  06/30/2021   COLONOSCOPY (Pts 45-28yrs Insurance coverage will need to be confirmed)  12/26/2030   Fall Risk  08/28/2021 02/25/2021 01/27/2021 09/20/2020 07/05/2020  Falls in the past year? 0 0 0 0 0  Number falls in past yr: 0 0 0 0 0  Injury with Fall? 0 0 0 0 0  Risk for fall due to : No Fall Risks - - - -  Follow up Falls evaluation completed;Education provided;Falls  prevention discussed - - - -   Functional Status Survey:    Vitals:   08/28/21 0848  BP: 138/72  Pulse: 78  Temp: (!) 97.3 F (36.3 C)  SpO2: 96%  Weight: 161 lb 6.4 oz (73.2 kg)  Height: 5\' 8"  (1.727 m)   Body mass index is 24.54 kg/m. Physical Exam Vitals reviewed.  Constitutional:      General: He is not in acute distress.    Appearance: Normal appearance. He is normal weight. He is not ill-appearing or diaphoretic.  HENT:     Head: Normocephalic.     Right Ear: Tympanic membrane, ear canal and external ear normal. There is no impacted cerumen.     Left Ear: Tympanic membrane, ear canal and external ear normal. There is no impacted cerumen.     Nose: Nose normal. No congestion or rhinorrhea.     Mouth/Throat:     Mouth: Mucous membranes are moist.  Pharynx: Oropharynx is clear. No oropharyngeal exudate or posterior oropharyngeal erythema.  Eyes:     General: No scleral icterus.       Right eye: No discharge.        Left eye: No discharge.     Extraocular Movements: Extraocular movements intact.     Conjunctiva/sclera: Conjunctivae normal.     Pupils: Pupils are equal, round, and reactive to light.  Neck:     Vascular: No carotid bruit.  Cardiovascular:     Rate and Rhythm: Normal rate and regular rhythm.     Pulses: Normal pulses.     Heart sounds: Normal heart sounds. No murmur heard.   No friction rub. No gallop.  Pulmonary:     Effort: Pulmonary effort is normal. No respiratory distress.     Breath sounds: Normal breath sounds. No wheezing, rhonchi or rales.  Chest:     Chest wall: No tenderness.  Abdominal:     General: Bowel sounds are normal. There is no distension.     Palpations: Abdomen is soft. There is no mass.     Tenderness: There is no abdominal tenderness. There is no right CVA tenderness, left CVA tenderness, guarding or rebound.  Musculoskeletal:        General: No swelling or tenderness. Normal range of motion.     Cervical back: Normal  range of motion. No rigidity or tenderness.     Right lower leg: No edema.     Left lower leg: No edema.  Lymphadenopathy:     Cervical: No cervical adenopathy.  Skin:    General: Skin is warm and dry.     Coloration: Skin is not pale.     Findings: No bruising, erythema, lesion or rash.  Neurological:     Mental Status: He is alert. Mental status is at baseline.     Cranial Nerves: No cranial nerve deficit.     Sensory: No sensory deficit.     Motor: No weakness.     Coordination: Coordination normal.     Gait: Gait abnormal.  Psychiatric:        Mood and Affect: Mood normal.        Speech: Speech normal.        Behavior: Behavior normal.        Thought Content: Thought content normal.        Cognition and Memory: Memory is impaired.        Judgment: Judgment normal.    Labs reviewed: Recent Labs    01/27/21 0000  NA 142  K 4.4  CL 106  CO2 25  GLUCOSE 102*  BUN 17  CREATININE 1.50*  CALCIUM 8.9   Recent Labs    01/27/21 0000  AST 17  ALT 15  BILITOT 0.4  PROT 7.4   Recent Labs    01/27/21 0000  WBC 7.9  NEUTROABS 4,835  HGB 14.7  HCT 45.6  MCV 87.0  PLT 195   Lab Results  Component Value Date   TSH 1.33 01/27/2021   Lab Results  Component Value Date   HGBA1C 5.3 01/27/2021   Lab Results  Component Value Date   CHOL 152 01/27/2021   HDL 64 01/27/2021   LDLCALC 75 01/27/2021   TRIG 45 01/27/2021   CHOLHDL 2.4 01/27/2021    Significant Diagnostic Results in last 30 days:  No results found.  Assessment/Plan 1. Depression with anxiety Mood stable Continue on sertraline   2. Vitamin B12 deficiency Continue on  vit B 12 supplement   3. Insomnia, unspecified type Mirtazapine effective    4. Decreased appetite Has improved on Mirtazapine.No weight loss noted.  5. Hyperlipidemia LDL goal <100 LDL at goal  - continue on Atorvastatin   6. Essential hypertension B/p at goal  Continue on Amlodipine   Family/ staff Communication:  Reviewed plan of care with patient verbalized understanding   Labs/tests ordered: Has labs orders in place   Next Appointment : 6 months for medical management of chronic issues with fasting Labs.   Sandrea Hughs, NP

## 2021-08-28 NOTE — Patient Instructions (Addendum)
Please get Tetanus vaccine,COVID-19 booster vaccine and Shingles vaccine ( Shingrix)

## 2021-08-29 LAB — CBC WITH DIFFERENTIAL/PLATELET
Absolute Monocytes: 684 cells/uL (ref 200–950)
Basophils Absolute: 27 cells/uL (ref 0–200)
Basophils Relative: 0.3 %
Eosinophils Absolute: 72 cells/uL (ref 15–500)
Eosinophils Relative: 0.8 %
HCT: 48.3 % (ref 38.5–50.0)
Hemoglobin: 15.7 g/dL (ref 13.2–17.1)
Lymphs Abs: 2853 cells/uL (ref 850–3900)
MCH: 29.2 pg (ref 27.0–33.0)
MCHC: 32.5 g/dL (ref 32.0–36.0)
MCV: 89.9 fL (ref 80.0–100.0)
MPV: 10.7 fL (ref 7.5–12.5)
Monocytes Relative: 7.6 %
Neutro Abs: 5364 cells/uL (ref 1500–7800)
Neutrophils Relative %: 59.6 %
Platelets: 218 10*3/uL (ref 140–400)
RBC: 5.37 10*6/uL (ref 4.20–5.80)
RDW: 12.4 % (ref 11.0–15.0)
Total Lymphocyte: 31.7 %
WBC: 9 10*3/uL (ref 3.8–10.8)

## 2021-08-29 LAB — COMPLETE METABOLIC PANEL WITH GFR
AG Ratio: 1.6 (calc) (ref 1.0–2.5)
ALT: 17 U/L (ref 9–46)
AST: 17 U/L (ref 10–35)
Albumin: 4.4 g/dL (ref 3.6–5.1)
Alkaline phosphatase (APISO): 113 U/L (ref 35–144)
BUN/Creatinine Ratio: 14 (calc) (ref 6–22)
BUN: 19 mg/dL (ref 7–25)
CO2: 26 mmol/L (ref 20–32)
Calcium: 9.2 mg/dL (ref 8.6–10.3)
Chloride: 106 mmol/L (ref 98–110)
Creat: 1.34 mg/dL — ABNORMAL HIGH (ref 0.70–1.28)
Globulin: 2.8 g/dL (calc) (ref 1.9–3.7)
Glucose, Bld: 101 mg/dL — ABNORMAL HIGH (ref 65–99)
Potassium: 4 mmol/L (ref 3.5–5.3)
Sodium: 141 mmol/L (ref 135–146)
Total Bilirubin: 0.5 mg/dL (ref 0.2–1.2)
Total Protein: 7.2 g/dL (ref 6.1–8.1)
eGFR: 56 mL/min/{1.73_m2} — ABNORMAL LOW (ref >=60)

## 2021-08-29 LAB — LIPID PANEL
Cholesterol: 169 mg/dL (ref <200)
HDL: 65 mg/dL (ref >=40)
LDL Cholesterol (Calc): 90 mg/dL (calc)
Non-HDL Cholesterol (Calc): 104 mg/dL (calc) (ref <130)
Total CHOL/HDL Ratio: 2.6 (calc) (ref <5.0)
Triglycerides: 48 mg/dL (ref <150)

## 2021-08-29 LAB — HEMOGLOBIN A1C
Hgb A1c MFr Bld: 5.1 % of total Hgb (ref <5.7)
Mean Plasma Glucose: 100 mg/dL
eAG (mmol/L): 5.5 mmol/L

## 2021-08-29 LAB — TSH: TSH: 1.62 mIU/L (ref 0.40–4.50)

## 2022-02-26 ENCOUNTER — Ambulatory Visit: Payer: Medicare Other | Admitting: Family

## 2022-04-23 ENCOUNTER — Ambulatory Visit (INDEPENDENT_AMBULATORY_CARE_PROVIDER_SITE_OTHER): Payer: Medicare Other | Admitting: Family

## 2022-04-23 ENCOUNTER — Encounter: Payer: Self-pay | Admitting: Family

## 2022-04-23 VITALS — BP 138/90 | HR 78 | Temp 97.6°F | Resp 16 | Ht 68.0 in | Wt 151.8 lb

## 2022-04-23 DIAGNOSIS — I10 Essential (primary) hypertension: Secondary | ICD-10-CM | POA: Diagnosis not present

## 2022-04-23 DIAGNOSIS — E785 Hyperlipidemia, unspecified: Secondary | ICD-10-CM

## 2022-04-23 DIAGNOSIS — F418 Other specified anxiety disorders: Secondary | ICD-10-CM

## 2022-04-23 DIAGNOSIS — R634 Abnormal weight loss: Secondary | ICD-10-CM

## 2022-04-23 DIAGNOSIS — F17209 Nicotine dependence, unspecified, with unspecified nicotine-induced disorders: Secondary | ICD-10-CM | POA: Diagnosis not present

## 2022-04-23 DIAGNOSIS — L853 Xerosis cutis: Secondary | ICD-10-CM

## 2022-04-23 DIAGNOSIS — G4700 Insomnia, unspecified: Secondary | ICD-10-CM

## 2022-04-23 DIAGNOSIS — F1020 Alcohol dependence, uncomplicated: Secondary | ICD-10-CM | POA: Insufficient documentation

## 2022-04-23 DIAGNOSIS — R630 Anorexia: Secondary | ICD-10-CM

## 2022-04-23 DIAGNOSIS — I739 Peripheral vascular disease, unspecified: Secondary | ICD-10-CM

## 2022-04-23 DIAGNOSIS — L602 Onychogryphosis: Secondary | ICD-10-CM | POA: Diagnosis not present

## 2022-04-23 DIAGNOSIS — E538 Deficiency of other specified B group vitamins: Secondary | ICD-10-CM | POA: Diagnosis not present

## 2022-04-23 MED ORDER — ATORVASTATIN CALCIUM 10 MG PO TABS: ORAL_TABLET | ORAL | 1 refills | Status: DC

## 2022-04-23 MED ORDER — ENSURE ACTIVE HIGH PROTEIN PO LIQD: 1.0000 | Freq: Every day | ORAL | 5 refills | Status: AC

## 2022-04-23 MED ORDER — NICOTINE 14 MG/24HR TD PT24: MEDICATED_PATCH | TRANSDERMAL | 1 refills | Status: DC

## 2022-04-23 MED ORDER — VITAMIN B-12 1000 MCG PO TABS
1000.0000 ug | ORAL_TABLET | Freq: Every day | ORAL | 1 refills | Status: DC
Start: 2022-04-23 — End: 2023-06-24

## 2022-04-23 MED ORDER — MIRTAZAPINE 7.5 MG PO TABS: ORAL_TABLET | ORAL | 1 refills | Status: DC

## 2022-04-23 MED ORDER — AMLODIPINE BESYLATE 5 MG PO TABS: 5.0000 mg | ORAL_TABLET | Freq: Every day | ORAL | 3 refills | Status: DC

## 2022-04-23 MED ORDER — CETAPHIL MOISTURIZING EX CREA: TOPICAL_CREAM | Freq: Two times a day (BID) | CUTANEOUS | 0 refills | Status: DC

## 2022-04-23 MED ORDER — SERTRALINE HCL 25 MG PO TABS: ORAL_TABLET | ORAL | 1 refills | Status: DC

## 2022-04-23 NOTE — Progress Notes (Signed)
Provider: Marlowe Sax FNP-C   Rumaldo Difatta, Nelda Bucks, NP  Patient Care Team: Nazirah Tri, Nelda Bucks, NP as PCP - General (Family Medicine)  Extended Emergency Contact Information Primary Emergency Contact: Elwin Sleight Mobile Phone: [phone] Relation: Other Secondary Emergency Contact: Milagros Evener Address: [address]          [place], [place] [address] Johnnette Litter of [place] Phone: [phone] Mobile Phone: [phone] Relation: Son  Code Status:  Full Code  Goals of care: Advanced Directive information    04/23/2022    8:23 AM  Advanced Directives  Does Patient Have a Medical Advance Directive? No  Would patient like information on creating a medical advance directive? No - Patient declined     Chief Complaint  Patient presents with   Medical Management of Chronic Issues    6 month follow up.   Labs    Fasting Labs     Immunizations    Discuss the need for Tetanus vaccine, Covid vaccine, and Shingrix vaccine.     HPI:  Pt is a 76 y.o. male seen today for 6 months follow up for medical management of chronic diseases.  Has some medical history of essential hypertension, type 2 diabetes, peripheral artery disease, hyperlipidemia, alcohol dependence with alcoholic hepatitis, depression with anxiety, cigarette smoking, mild cognitive impairment, osteoarthritis of left hip among other conditions.  Hypertension -no home blood pressure for review. Taking amlodipine as directed.  Declined any dizziness but family member present states sometimes complains of dizziness.  Blood pressure today stable compared to previous.denies any headache,vision changes,fatigue,chest tightness,palpitation,chest pain or shortness of breath.      Alcohol uses - Family member with him today states patient drinking alcohol everyday.Tends to hallucinate whenever he drinks Patient admits to Drinking  1 -2 beers daily.   Smoker - Continues to smoke 1 pack per day.did not pick up Nicotine  patch previously ordered on previous visit.  Discussed smoking cessation.We will resend nicotine patch.  Advised to avoid smoking while wearing a patch.  Weight loss - Has had a 10 lbs weight loss since last visit.Family member states not eating as he is supposed to. Denies feeling depressed.   Recommended fasting lab work today  Health maintenance: Due for Tdap-advised on previous visit to get Tdap vaccine at her pharmacy states he has already gotten but not on records.  [facility] reviewed not given.  Discussed to get tetanus vaccine at his pharmacy.  We will send another prescription. Shingrix vaccine-also advised to get vaccine at the pharmacy   Past Medical History:  Diagnosis Date   Alcoholic hepatitis    Anxiety associated with depression    Blurred vision    Gait disturbance    High blood pressure    Per [facility] Packet    High cholesterol     Per [facility] Packet    History of brain surgery    Lumbar radiculopathy    Lumbar radiculopathy    Nicotine addiction    Pain in leg, unspecified    Type 2 diabetes mellitus ([facility])    Vitamin D deficiency    Past Surgical History:  Procedure Laterality Date   BRAIN SURGERY     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    [place]     in [place] many [place]rs ago- he cannnot remember when or where     Allergies  Allergen Reactions   Penicillins Other (See Comments)    Makes pt hurt all over  Allergies as of 04/23/2022       Reactions   Penicillins Other (See Comments)   Makes pt hurt all over         Medication List        Accurate as of Apr 23, 2022  8:45 AM. If you have any questions, ask your nurse or doctor.          acetaminophen 500 MG tablet Commonly known as: TYLENOL Take 1 tablet (500 mg total) by mouth every 6 (six) hours as needed.   amLODipine 5 MG tablet Commonly known as: NORVASC Take 1 tablet (5 mg total) by mouth daily.   atorvastatin 10 MG tablet Commonly known as: LIPITOR TAKE  1 TABLET(10 MG) BY MOUTH DAILY   hydrocortisone 1 % ointment Apply 1 application topically 2 (two) times daily.   mirtazapine 7.5 MG tablet Commonly known as: REMERON TAKE 1 TABLET(7.5 MG) BY MOUTH AT BEDTIME   nicotine 14 mg/24hr patch Commonly known as: Nicoderm CQ Apply 14 mg Patch topically daily x 6 weeks then  Apply 7 mg patch Topically daily x 2 weeks then stop Avoid smoking while on patch   sertraline 25 MG tablet Commonly known as: ZOLOFT TAKE 1 TABLET(25 MG) BY MOUTH DAILY   vitamin B-12 1000 MCG tablet Commonly known as: CYANOCOBALAMIN Take 1 tablet (1,000 mcg total) by mouth daily.        Review of Systems  Constitutional:  Positive for unexpected weight change. Negative for appetite change, chills, fatigue and fever.  HENT:  Negative for congestion, ear discharge, ear pain, facial swelling, hearing loss, nosebleeds, postnasal drip, rhinorrhea, sinus pressure, sinus pain, sneezing, sore throat, tinnitus and trouble swallowing.        Poor dentition has not see a dentist   Eyes:  Negative for pain, discharge, redness, itching and visual disturbance.  Respiratory:  Negative for cough, chest tightness, shortness of breath and wheezing.   Cardiovascular:  Negative for chest pain, palpitations and leg swelling.  Gastrointestinal:  Negative for abdominal distention, abdominal pain, blood in stool, constipation, diarrhea, nausea and vomiting.  Endocrine: Negative for cold intolerance, heat intolerance, polydipsia, polyphagia and polyuria.  Genitourinary:  Negative for difficulty urinating, dysuria, flank pain, frequency and urgency.  Musculoskeletal:  Positive for arthralgias. Negative for back pain, gait problem, joint swelling, myalgias, neck pain and neck stiffness.       Left hip pain tylenol effective   Skin:  Negative for color change, pallor, rash and wound.       Dry skin states applying Vaseline but POA dispute   Neurological:  Negative for dizziness, syncope,  speech difficulty, weakness, light-headedness, numbness and headaches.  Hematological:  Does not bruise/bleed easily.  Psychiatric/Behavioral:  Negative for agitation, behavioral problems, confusion, self-injury, sleep disturbance and suicidal ideas. The patient is not nervous/anxious.        Hallucination with drinking alcohol    Immunization History  Administered Date(s) Administered   Fluad Quad(high Dose 65+) 09/20/2020, 08/28/2021   Moderna Sars-Covid-2 Vaccination 11/05/2020   Pneumococcal Conjugate-13 07/02/2009   Pneumococcal Polysaccharide-23 01/27/2021   Pertinent  Health Maintenance Due  Topic Date Due   INFLUENZA VACCINE  06/30/2022   COLONOSCOPY (Pts 45-85yr Insurance coverage will need to be confirmed)  12/26/2030      09/20/2020   10:23 AM 01/27/2021   10:39 AM 02/25/2021    8:49 AM 08/28/2021    8:52 AM 04/23/2022    8:23 AM  Fall Risk  Falls in the past  year? 0 0 0 0 0  Was there an injury with Fall? 0 0 0 0 0  Fall Risk Category Calculator 0 0 0 0 0  Fall Risk Category _0   Patient Fall Risk Level _1   Patient at Risk for Falls Due to    No Fall Risks No Fall Risks  Fall risk Follow up    Falls evaluation completed;Education provided;Falls prevention discussed Falls evaluation completed   Functional Status Survey:    Vitals:   04/23/22 0833  BP: 138/90  Pulse: 78  Resp: 16  Temp: 97.6 F (36.4 C)  SpO2: 98%  Weight: 151 lb 12.8 oz (68.9 kg)  Height: _2  (1.727 m)   Body mass index is 23.08 kg/m. Physical Exam Vitals reviewed.  Constitutional:      General: He is not in acute distress.    Appearance: Normal appearance. He is normal weight. He is not ill-appearing or diaphoretic.  HENT:     Head: Normocephalic.     Right Ear: Tympanic membrane, ear canal and external ear normal. There is no impacted cerumen.     Left Ear: Tympanic membrane, ear canal and external  ear normal. There is no impacted cerumen.     Nose: Nose normal. No congestion or rhinorrhea.     Mouth/Throat:     Mouth: Mucous membranes are moist.     Pharynx: Oropharynx is clear. No oropharyngeal exudate or posterior oropharyngeal erythema.  Eyes:     General: No scleral icterus.       Right eye: No discharge.        Left eye: No discharge.     Extraocular Movements: Extraocular movements intact.     Conjunctiva/sclera: Conjunctivae normal.     Pupils: Pupils are equal, round, and reactive to light.  Neck:     Vascular: No carotid bruit.  Cardiovascular:     Rate and Rhythm: Normal rate and regular rhythm.     Pulses: Normal pulses.     Heart sounds: Normal heart sounds. No murmur heard.   No friction rub. No gallop.  Pulmonary:     Effort: Pulmonary effort is normal. No respiratory distress.     Breath sounds: Normal breath sounds. No wheezing, rhonchi or rales.  Chest:     Chest wall: No tenderness.  Abdominal:     General: Bowel sounds are normal. There is no distension.     Palpations: Abdomen is soft. There is no mass.     Tenderness: There is no abdominal tenderness. There is no right CVA tenderness, left CVA tenderness, guarding or rebound.  Musculoskeletal:        General: No swelling or tenderness. Normal range of motion.     Cervical back: Normal range of motion. No rigidity or tenderness.     Right lower leg: No edema.     Left lower leg: No edema.  Lymphadenopathy:     Cervical: No cervical adenopathy.  Skin:    General: Skin is warm and dry.     Coloration: Skin is not pale.     Findings: No bruising, erythema, lesion or rash.  Neurological:     Mental Status: He is alert and oriented to person, place, and time.     Cranial Nerves: No cranial nerve deficit.     Sensory: No sensory deficit.     Motor: No weakness.  Coordination: Coordination normal.     Gait: Gait normal.  Psychiatric:        Mood and Affect: Mood normal.        Speech: Speech  normal.        Behavior: Behavior normal.        Thought Content: Thought content normal.        Judgment: Judgment normal.    Labs reviewed: Recent Labs    08/28/21 1057  NA 141  K 4.0  CL 106  CO2 26  GLUCOSE 101*  BUN 19  CREATININE 1.34*  CALCIUM 9.2   Recent Labs    08/28/21 1057  AST 17  ALT 17  BILITOT 0.5  PROT 7.2   Recent Labs    08/28/21 1057  WBC 9.0  NEUTROABS 5,364  HGB 15.7  HCT 48.3  MCV 89.9  PLT 218   Lab Results  Component Value Date   TSH 1.62 08/28/2021   Lab Results  Component Value Date   HGBA1C 5.1 08/28/2021   Lab Results  Component Value Date   CHOL 169 08/28/2021   HDL 65 08/28/2021   LDLCALC 90 08/28/2021   TRIG 48 08/28/2021   CHOLHDL 2.6 08/28/2021    Significant Diagnostic Results in last 30 days:  No results found.  Assessment/Plan 1. Essential hypertension Blood pressure stable -Continue on amlodipine - amLODipine (NORVASC) 5 MG tablet; Take 1 tablet (5 mg total) by mouth daily.  Dispense: 90 tablet; Refill: 3 - CBC with Differential/Platelet - CMP with eGFR([facility]) - TSH  2. Hyperlipidemia LDL goal <100 LDL at goal -No muscle weakness or muscle aches reported -Continue on atorvastatin - atorvastatin (LIPITOR) 10 MG tablet; TAKE 1 TABLET(10 MG) BY MOUTH DAILY  Dispense: 90 tablet; Refill: 1 - Lipid Panel  3. Insomnia, unspecified type -Mirtazapine effective - mirtazapine (REMERON) 7.5 MG tablet; TAKE 1 TABLET(7.5 MG) BY MOUTH AT BEDTIME  Dispense: 90 tablet; Refill: 1  4. Decreased appetite Not eating well per POA  Continue on mirtazapine - mirtazapine (REMERON) 7.5 MG tablet; TAKE 1 TABLET(7.5 MG) BY MOUTH AT BEDTIME  Dispense: 90 tablet; Refill: 1  5. Tobacco use disorder, continuous Did not pick up nicotine patch as prescribed on previous visit.  We will reorder nicotine patch today -Advised to avoid smoking while wearing a patch - nicotine (NICODERM CQ) 14 mg/24hr patch; Apply 14 mg Patch  topically daily x 6 weeks then  Apply 7 mg patch Topically daily x 2 weeks then stop Avoid smoking while on patch  Dispense: 24 patch; Refill: 1  6. Depression with anxiety Declined feeling depressed or issues with anxiety. We will continue on sertraline - sertraline (ZOLOFT) 25 MG tablet; TAKE 1 TABLET(25 MG) BY MOUTH DAILY  Dispense: 90 tablet; Refill: 1  7. Vitamin B12 deficiency Given alcohol use will continue on vitamin B12 supplements - vitamin B-12 (CYANOCOBALAMIN) 1000 MCG tablet; Take 1 tablet (1,000 mcg total) by mouth daily.  Dispense: 90 tablet; Refill: 1  8. Dry skin dermatitis Advised to up apply cetaphil twice daily  -Encouraged to increase water intake and cut down on alcohol - Emollient (CETAPHIL) cream; Apply topically 2 (two) times daily. Both legs  Dispense: 90 g; Refill: 0  9. Overgrown toenails Bilateral feet overgrown toenails Will refer to podiatrist to trim toenails - Ambulatory referral to Podiatry  10. Uncomplicated alcohol dependence ([place]) Advised to cut down on alcohol use  11. Peripheral arterial disease ([facility]) No ulceration noted Continue to monitor  12.  Abnormal weight loss Has had 10 pounds weight loss since last visit suspect possible due to poor intake and also use of alcohol.  Advised to drink Ensure 1 can daily - Nutritional Supplements (ENSURE ACTIVE HIGH PROTEIN) LIQD; Take 1 Can by mouth daily.  Dispense: 237 mL; Refill: 5  Family/ staff Communication: Reviewed plan of care with patient and family member verbalized understanding  Labs/tests ordered:  - CBC with Differential/Platelet - CMP with eGFR([facility]) - TSH - Lipid panel  Next Appointment : 6 months for medical management of chronic issues  Sandrea Hughs, NP

## 2022-04-27 LAB — COMPLETE METABOLIC PANEL WITH GFR
AG Ratio: 1.5 (calc) (ref 1.0–2.5)
ALT: 13 U/L (ref 9–46)
AST: 14 U/L (ref 10–35)
Albumin: 4.6 g/dL (ref 3.6–5.1)
Alkaline phosphatase (APISO): 117 U/L (ref 35–144)
BUN/Creatinine Ratio: 13 (calc) (ref 6–22)
BUN: 17 mg/dL (ref 7–25)
CO2: 23 mmol/L (ref 20–32)
Calcium: 9.8 mg/dL (ref 8.6–10.3)
Chloride: 106 mmol/L (ref 98–110)
Creat: 1.31 mg/dL — ABNORMAL HIGH (ref 0.70–1.28)
Globulin: 3 g/dL (calc) (ref 1.9–3.7)
Glucose, Bld: 101 mg/dL — ABNORMAL HIGH (ref 65–99)
Potassium: 4.7 mmol/L (ref 3.5–5.3)
Sodium: 140 mmol/L (ref 135–146)
Total Bilirubin: 0.4 mg/dL (ref 0.2–1.2)
Total Protein: 7.6 g/dL (ref 6.1–8.1)
eGFR: 57 mL/min/{1.73_m2} — ABNORMAL LOW (ref >=60)

## 2022-04-27 LAB — CBC WITH DIFFERENTIAL/PLATELET
Absolute Monocytes: 621 cells/uL (ref 200–950)
Basophils Absolute: 27 cells/uL (ref 0–200)
Basophils Relative: 0.3 %
Eosinophils Absolute: 99 cells/uL (ref 15–500)
Eosinophils Relative: 1.1 %
HCT: 49.2 % (ref 38.5–50.0)
Hemoglobin: 15.7 g/dL (ref 13.2–17.1)
Lymphs Abs: 2772 cells/uL (ref 850–3900)
MCH: 28.8 pg (ref 27.0–33.0)
MCHC: 31.9 g/dL — ABNORMAL LOW (ref 32.0–36.0)
MCV: 90.1 fL (ref 80.0–100.0)
MPV: 12.1 fL (ref 7.5–12.5)
Monocytes Relative: 6.9 %
Neutro Abs: 5481 cells/uL (ref 1500–7800)
Neutrophils Relative %: 60.9 %
Platelets: 211 10*3/uL (ref 140–400)
RBC: 5.46 10*6/uL (ref 4.20–5.80)
RDW: 11.9 % (ref 11.0–15.0)
Total Lymphocyte: 30.8 %
WBC: 9 10*3/uL (ref 3.8–10.8)

## 2022-04-27 LAB — LIPID PANEL
Cholesterol: 181 mg/dL (ref <200)
HDL: 68 mg/dL (ref >=40)
LDL Cholesterol (Calc): 92 mg/dL (calc)
Non-HDL Cholesterol (Calc): 113 mg/dL (calc) (ref <130)
Total CHOL/HDL Ratio: 2.7 (calc) (ref <5.0)
Triglycerides: 111 mg/dL (ref <150)

## 2022-04-27 LAB — HEMOGLOBIN A1C
Hgb A1c MFr Bld: 5.2 % of total Hgb (ref <5.7)
Mean Plasma Glucose: 103 mg/dL
eAG (mmol/L): 5.7 mmol/L

## 2022-04-27 LAB — TSH: TSH: 1.64 mIU/L (ref 0.40–4.50)

## 2022-04-29 ENCOUNTER — Telehealth: Payer: Self-pay

## 2022-04-29 NOTE — Telephone Encounter (Signed)
See other telephone encounter dated 04/29/2022

## 2022-04-29 NOTE — Telephone Encounter (Signed)
Called patient back from this morning with answer to level of his fathers CKD. In speaking with him and updating him that it was Stage 3 he wanted to set up another appointment for 05/04/2022 at 10 am to discuss with PCP. I set the appointment. No further action needed at this time.routed to PCP

## 2022-04-29 NOTE — Telephone Encounter (Signed)
Chronic kidney disease stage 3 a

## 2022-04-29 NOTE — Telephone Encounter (Signed)
Spoke with patients son about A1C but would like to know what stage of Chronic Kidney Disease is his father dealing with? (Not listed on problem list or recent labs)

## 2022-04-29 NOTE — Telephone Encounter (Signed)
Noted  

## 2022-05-04 ENCOUNTER — Ambulatory Visit (INDEPENDENT_AMBULATORY_CARE_PROVIDER_SITE_OTHER): Payer: Medicare Other | Admitting: Family

## 2022-05-04 ENCOUNTER — Encounter: Payer: Self-pay | Admitting: Family

## 2022-05-04 VITALS — BP 122/88 | HR 55 | Temp 96.0°F | Resp 16 | Ht 68.0 in | Wt 146.2 lb

## 2022-05-04 DIAGNOSIS — N1831 Chronic kidney disease, stage 3a: Secondary | ICD-10-CM | POA: Diagnosis not present

## 2022-05-04 DIAGNOSIS — N183 Chronic kidney disease, stage 3 unspecified: Secondary | ICD-10-CM

## 2022-05-04 DIAGNOSIS — I10 Essential (primary) hypertension: Secondary | ICD-10-CM

## 2022-05-04 DIAGNOSIS — R634 Abnormal weight loss: Secondary | ICD-10-CM

## 2022-05-04 DIAGNOSIS — E785 Hyperlipidemia, unspecified: Secondary | ICD-10-CM | POA: Diagnosis not present

## 2022-05-04 DIAGNOSIS — I129 Hypertensive chronic kidney disease with stage 1 through stage 4 chronic kidney disease, or unspecified chronic kidney disease: Secondary | ICD-10-CM

## 2022-05-04 NOTE — Progress Notes (Signed)
Provider: Marlowe Sax FNP-C  Shedric Fredericks, Nelda Bucks, NP  Patient Care Team: Makenize Messman, Nelda Bucks, NP as PCP - General (Family Medicine)  Extended Emergency Contact Information Primary Emergency Contact: Elwin Sleight Mobile Phone: [phone] Relation: Other Secondary Emergency Contact: Milagros Evener Address: [address]          [place], [place] [address] Johnnette Litter of [place] Phone: [phone] Mobile Phone: [phone] Relation: Son  Code Status:  DNR Goals of care: Advanced Directive information    05/04/2022   10:24 AM  Advanced Directives  Does Patient Have a Medical Advance Directive? No  Would patient like information on creating a medical advance directive? No - Patient declined     Chief Complaint  Patient presents with   Acute Visit    Patient wants to discuss stage 3 chronic kidney disease in person to get better understanding and to make necessary lifestyle changes.     HPI:  Pt is a 76 y.o. male seen today for an acute visit to discuss CKD stage 3 and weight loss.He is here with son who states assist patient would like to know what he should be avoiding when cooking for patient. Recent lab results reviewed and discussed with patient and son Labs unremarkable except glucose was slightly high 101, hemoglobin A1c 5.2  and and kidney function indicates chronic kidney disease stable creatinine 1.31 previous was 1.34. Discussed to avoid nephrotoxins and to control high risk factors. Has had 5.8 lbs weight loss over one month.son will be resposible for making all his meals.   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   amLODipine (NORVASC) 5 MG tablet Take 1 tablet (5 mg total) by mouth daily.   atorvastatin (LIPITOR) 10 MG tablet TAKE 1 TABLET(10 MG) BY MOUTH DAILY   Emollient (CETAPHIL) cream Apply topically 2 (two) times daily. Both legs   hydrocortisone 1 % ointment Apply 1 application topically 2 (two) times daily.   mirtazapine (REMERON) 7.5 MG tablet TAKE 1 TABLET(7.5 MG) BY MOUTH AT BEDTIME   nicotine (NICODERM CQ) 14 mg/24hr patch Apply 14 mg Patch topically daily x 6 weeks then  Apply 7 mg patch Topically daily x 2 weeks then stop Avoid smoking while on patch   Nutritional Supplements (ENSURE ACTIVE HIGH PROTEIN) LIQD Take 1 Can by mouth daily.   sertraline (ZOLOFT) 25 MG tablet TAKE 1 TABLET(25  MG) BY MOUTH DAILY   Tdap (BOOSTRIX) 5-2.5-18.5 LF-MCG/0.5 injection Inject 0.5 mLs into the muscle once.   vitamin B-12 (CYANOCOBALAMIN) 1000 MCG tablet Take 1 tablet (1,000 mcg total) by mouth daily.   No facility-administered encounter medications on file as of 05/04/2022.    Review of Systems  Constitutional:  Negative for appetite change, chills, fatigue, fever and unexpected weight change.  HENT:  Negative for congestion, dental problem, ear discharge, ear pain, facial swelling, hearing loss, nosebleeds, postnasal drip, rhinorrhea, sinus pressure, sinus pain, sneezing, sore throat, tinnitus and trouble swallowing.   Eyes:  Negative for pain, discharge, redness, itching and visual  disturbance.  Respiratory:  Negative for cough, chest tightness, shortness of breath and wheezing.   Cardiovascular:  Negative for chest pain, palpitations and leg swelling.  Gastrointestinal:  Negative for abdominal distention, abdominal pain, blood in stool, constipation, diarrhea, nausea and vomiting.  Endocrine: Negative for cold intolerance, heat intolerance, polydipsia, polyphagia and polyuria.  Genitourinary:  Negative for difficulty urinating, dysuria, flank pain, frequency and urgency.  Musculoskeletal:  Negative for arthralgias, back pain, gait problem, joint swelling, myalgias, neck pain and neck stiffness.  Skin:  Negative for color change, pallor, rash and wound.  Neurological:  Negative for dizziness, syncope, speech difficulty, weakness, light-headedness, numbness and headaches.  Hematological:  Does not bruise/bleed easily.  Psychiatric/Behavioral:  Negative for agitation, behavioral problems, confusion and sleep disturbance. The patient is not nervous/anxious.        All brothers and sister have passed away still feels depressed from the loss.states glad that he has his youngest son who helps him.    Immunization History  Administered Date(s) Administered   Fluad Quad(high Dose 65+) 09/20/2020, 08/28/2021   Moderna Sars-Covid-2 Vaccination 11/05/2020   Pneumococcal Conjugate-13 07/02/2009   Pneumococcal Polysaccharide-23 01/27/2021   Pertinent  Health Maintenance Due  Topic Date Due   INFLUENZA VACCINE  06/30/2022   COLONOSCOPY (Pts 45-51yr Insurance coverage will need to be confirmed)  12/26/2030      01/27/2021   10:39 AM 02/25/2021    8:49 AM 08/28/2021    8:52 AM 04/23/2022    8:23 AM 05/04/2022   10:24 AM  Fall Risk  Falls in the past year? 0 0 0 0 0  Was there an injury with Fall? 0 0 0 0 0  Fall Risk Category Calculator 0 0 0 0 0  Fall Risk Category Low Low Low Low Low  Patient Fall Risk Level Low fall risk Low fall risk Low fall risk Low fall risk Low fall  risk  Patient at Risk for Falls Due to   No Fall Risks No Fall Risks No Fall Risks  Fall risk Follow up   Falls evaluation completed;Education provided;Falls prevention discussed Falls evaluation completed Falls evaluation completed   Functional Status Survey:    Vitals:   05/04/22 1017  BP: 122/88  Pulse: (!) 55  Resp: 16  Temp: (!) 96 F (35.6 C)  SpO2: 99%  Weight: 146 lb 3.2 oz (66.3 kg)  Height: '5\' 8"'$  (1.727 m)   Body mass index is 22.23 kg/m. Physical Exam Vitals reviewed.  Constitutional:      General: He is not in acute distress.    Appearance: Normal appearance. He is normal weight. He is not ill-appearing or diaphoretic.  HENT:     Head: Normocephalic.     Right Ear: Tympanic membrane, ear canal and external ear normal. There is no impacted cerumen.     Left Ear: Tympanic membrane,  ear canal and external ear normal. There is no impacted cerumen.     Nose: Nose normal. No congestion or rhinorrhea.     Mouth/Throat:     Mouth: Mucous membranes are moist.     Pharynx: Oropharynx is clear. No oropharyngeal exudate or posterior oropharyngeal erythema.  Eyes:     General: No scleral icterus.       Right eye: No discharge.        Left eye: No discharge.     Extraocular Movements: Extraocular movements intact.     Conjunctiva/sclera: Conjunctivae normal.     Pupils: Pupils are equal, round, and reactive to light.  Neck:     Vascular: No carotid bruit.  Cardiovascular:     Rate and Rhythm: Normal rate and regular rhythm.     Pulses: Normal pulses.     Heart sounds: Normal heart sounds. No murmur heard.   No friction rub. No gallop.  Pulmonary:     Effort: Pulmonary effort is normal. No respiratory distress.     Breath sounds: Normal breath sounds. No wheezing, rhonchi or rales.  Chest:     Chest wall: No tenderness.  Abdominal:     General: Bowel sounds are normal. There is no distension.     Palpations: Abdomen is soft. There is no mass.     Tenderness: There  is no abdominal tenderness. There is no right CVA tenderness, left CVA tenderness, guarding or rebound.  Musculoskeletal:        General: No swelling or tenderness. Normal range of motion.     Cervical back: Normal range of motion. No rigidity or tenderness.     Right lower leg: No edema.     Left lower leg: No edema.  Lymphadenopathy:     Cervical: No cervical adenopathy.  Skin:    General: Skin is warm and dry.     Coloration: Skin is not pale.     Findings: No bruising, erythema, lesion or rash.  Neurological:     Mental Status: He is alert and oriented to person, place, and time.     Cranial Nerves: No cranial nerve deficit.     Sensory: No sensory deficit.     Motor: No weakness.     Coordination: Coordination normal.     Gait: Gait normal.  Psychiatric:        Mood and Affect: Mood normal.        Speech: Speech normal.        Behavior: Behavior normal.        Thought Content: Thought content normal.        Judgment: Judgment normal.    Labs reviewed: Recent Labs    08/28/21 1057 04/23/22 0902  NA 141 140  K 4.0 4.7  CL 106 106  CO2 26 23  GLUCOSE 101* 101*  BUN 19 17  CREATININE 1.34* 1.31*  CALCIUM 9.2 9.8   Recent Labs    08/28/21 1057 04/23/22 0902  AST 17 14  ALT 17 13  BILITOT 0.5 0.4  PROT 7.2 7.6   Recent Labs    08/28/21 1057 04/23/22 0902  WBC 9.0 9.0  NEUTROABS 5,364 5,481  HGB 15.7 15.7  HCT 48.3 49.2  MCV 89.9 90.1  PLT 218 211   Lab Results  Component Value Date   TSH 1.64 04/23/2022   Lab Results  Component Value Date   HGBA1C 5.2 04/23/2022   Lab Results  Component Value Date   CHOL 181 04/23/2022  HDL 68 04/23/2022   LDLCALC 92 04/23/2022   TRIG 111 04/23/2022   CHOLHDL 2.7 04/23/2022    Significant Diagnostic Results in last 30 days:  No results found.  Assessment/Plan  1. Abnormal weight loss Has lost 5.8 lbs over one month.son will be assisting with all meals. - Drink Ensure one can daily. - continue to  monitor weight at home weekly and notify provider for any abrupt weight loss.  -Continue on mirtazapine  2. Benign hypertension with CKD (chronic kidney disease) stage III (HCC) Blood pressure well controlled Continue on amlodipine  3. Hyperlipidemia LDL goal <100 LDL at goal  -Continue on atorvastatin  4. Stage 3a chronic kidney disease ([place]) Will continue to avoid Nephrotoxins and dose all other medication for renal clearance  Discussed with son and patient to avoid nephrotoxins such asNSAIDs and to control high risk factors.  Family/ staff Communication: Reviewed plan of care with patient and son verbalized understanding   Labs/tests ordered: None   Next Appointment: As needed if symptoms worsen or fail to improve    Sandrea Hughs, NP

## 2022-05-04 NOTE — Patient Instructions (Signed)
-   Drink Ensure one by mouth daily   - check weight once a week Notify provider for any abrupt loss

## 2022-09-18 ENCOUNTER — Other Ambulatory Visit: Payer: Self-pay | Admitting: Family

## 2022-09-18 DIAGNOSIS — R630 Anorexia: Secondary | ICD-10-CM

## 2022-09-18 DIAGNOSIS — G4700 Insomnia, unspecified: Secondary | ICD-10-CM

## 2022-09-18 NOTE — Telephone Encounter (Signed)
Patient has request refill on medication Mirtazipine 7.'5mg'$ . Patient last refill dated 04/23/2022. Patient medication has High Risk Warnings. Medication pend and sent to Sherrie Mustache, NP due to PCP Ngetich, Nelda Bucks, NP being out of office.

## 2022-10-26 ENCOUNTER — Ambulatory Visit (INDEPENDENT_AMBULATORY_CARE_PROVIDER_SITE_OTHER): Payer: Medicare Other | Admitting: Family

## 2022-10-26 ENCOUNTER — Encounter: Payer: Self-pay | Admitting: Family

## 2022-10-26 VITALS — BP 102/82 | HR 73 | Temp 97.9°F | Resp 16 | Ht 68.0 in | Wt 157.0 lb

## 2022-10-26 DIAGNOSIS — E785 Hyperlipidemia, unspecified: Secondary | ICD-10-CM

## 2022-10-26 DIAGNOSIS — I129 Hypertensive chronic kidney disease with stage 1 through stage 4 chronic kidney disease, or unspecified chronic kidney disease: Secondary | ICD-10-CM | POA: Diagnosis not present

## 2022-10-26 DIAGNOSIS — N183 Chronic kidney disease, stage 3 unspecified: Secondary | ICD-10-CM | POA: Diagnosis not present

## 2022-10-26 DIAGNOSIS — Z23 Encounter for immunization: Secondary | ICD-10-CM | POA: Diagnosis not present

## 2022-10-26 DIAGNOSIS — G4700 Insomnia, unspecified: Secondary | ICD-10-CM | POA: Diagnosis not present

## 2022-10-26 DIAGNOSIS — F418 Other specified anxiety disorders: Secondary | ICD-10-CM | POA: Diagnosis not present

## 2022-10-26 DIAGNOSIS — R630 Anorexia: Secondary | ICD-10-CM | POA: Diagnosis not present

## 2022-10-26 MED ORDER — MIRTAZAPINE 7.5 MG PO TABS: ORAL_TABLET | ORAL | 1 refills | Status: DC

## 2022-10-26 MED ORDER — SERTRALINE HCL 25 MG PO TABS: ORAL_TABLET | ORAL | 1 refills | Status: DC

## 2022-10-26 MED ORDER — AMLODIPINE BESYLATE 5 MG PO TABS: 5.0000 mg | ORAL_TABLET | Freq: Every day | ORAL | 3 refills | Status: DC

## 2022-10-26 NOTE — Progress Notes (Signed)
Provider: Marlowe Sax FNP-C   Kedrick Mcnamee, Nelda Bucks, NP  Patient Care Team: Cutberto Winfree, Nelda Bucks, NP as PCP - General (Family Medicine)  Extended Emergency Contact Information Primary Emergency Contact: Elwin Sleight Mobile Phone: [phone] Relation: Other Secondary Emergency Contact: Milagros Evener Address: [address]          [place], [place] [address] Johnnette Litter of [place] Phone: [phone] Mobile Phone: [phone] Relation: Son  Code Status:  Full Code  Goals of care: Advanced Directive information    10/26/2022   10:21 AM  Advanced Directives  Does Patient Have a Medical Advance Directive? Yes  Type of Advance Directive [place]  Does patient want to make changes to medical advance directive? No - Patient declined  Copy of [place] in Chart? Yes - validated most recent copy scanned in chart (See row information)     Chief Complaint  Patient presents with   Medical Management of Chronic Issues    6 month follow up.    Health Maintenance    Discuss the need for AWV.   Immunizations    Discuss the need for Shingrix vaccine, Influenza vaccine, and 2nd Covid Booster.    HPI:  Pt is a 76 y.o. male seen today for 6 months follow up medical management of chronic diseases.  He is here with his son who provides additional HPI information.  Hypertension - no home B/p readings for review.He denies any headache,dizziness,vision changes,fatigue,chest tightness,palpitation,chest pain or shortness of breath.    Depression and Anxiety - walking around helps.denies any SI /thoughts.     Smoker - has quit smoking.  Left hip pain - has not take tylenol.Follows up with Orthopedic.   Due for influenza vaccine will get vaccine today.  Also due for Shingles and second COVID-19 booster vaccine.Aware to get vaccine in the pharmacy.   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 If you have any questions, ask your nurse or doctor.          STOP taking these medications    Tdap 5-2.5-18.5 LF-MCG/0.5 injection Commonly known as: BOOSTRIX Stopped by: Sandrea Hughs, NP       TAKE these medications    acetaminophen 500 MG tablet Commonly known as:  TYLENOL Take 1 tablet (500 mg total) by mouth every 6 (six) hours as needed.   amLODipine 5 MG tablet Commonly known as: NORVASC Take 1 tablet (5 mg total) by mouth daily.   atorvastatin 10 MG tablet Commonly known as: LIPITOR TAKE 1 TABLET(10 MG) BY MOUTH DAILY   cetaphil cream Apply topically 2 (two) times daily. Both legs   cyanocobalamin 1000 MCG tablet Commonly known as: VITAMIN B12 Take 1 tablet (1,000 mcg total) by mouth daily.   hydrocortisone 1 % ointment Apply 1 application topically 2 (two) times daily.   mirtazapine 7.5 MG tablet Commonly known as: REMERON TAKE 1 TABLET(7.5 MG) BY MOUTH AT BEDTIME   nicotine 14 mg/24hr patch Commonly known as: Nicoderm CQ Apply 14 mg Patch topically daily x 6 weeks then  Apply 7 mg patch Topically daily x 2 weeks then stop Avoid  smoking while on patch   sertraline 25 MG tablet Commonly known as: ZOLOFT TAKE 1 TABLET(25 MG) BY MOUTH DAILY        Review of Systems  Constitutional:  Negative for appetite change, chills, fatigue, fever and unexpected weight change.  HENT:  Negative for congestion, dental problem, ear discharge, ear pain, facial swelling, hearing loss, nosebleeds, postnasal drip, rhinorrhea, sinus pressure, sinus pain, sneezing, sore throat, tinnitus and trouble swallowing.   Eyes:  Negative for pain, discharge, redness, itching and visual disturbance.  Respiratory:  Negative for cough, chest tightness, shortness of breath and wheezing.   Cardiovascular:  Negative for chest pain, palpitations and leg swelling.  Gastrointestinal:  Negative for abdominal distention, abdominal pain, blood in stool, constipation, diarrhea, nausea and vomiting.  Endocrine: Negative for cold intolerance, heat intolerance, polydipsia, polyphagia and polyuria.  Genitourinary:  Negative for difficulty urinating, dysuria, flank pain, frequency and urgency.  Musculoskeletal:  Positive for arthralgias and gait problem. Negative for back pain, joint swelling, myalgias, neck pain and neck stiffness.       Left hip pain  Skin:  Negative for color change, pallor, rash and wound.  Neurological:  Negative for dizziness, syncope, speech difficulty, weakness, light-headedness, numbness and headaches.  Hematological:  Does not bruise/bleed easily.  Psychiatric/Behavioral:  Negative for agitation, behavioral problems, confusion, hallucinations, self-injury, sleep disturbance and suicidal ideas. The patient is not nervous/anxious.     Immunization History  Administered Date(s) Administered   Fluad Quad(high Dose 65+) 09/20/2020, 08/28/2021   Moderna Sars-Covid-2 Vaccination 11/05/2020   Pneumococcal Conjugate-13 07/02/2009   Pneumococcal Polysaccharide-23 01/27/2021   Pertinent  Health Maintenance Due  Topic Date Due   INFLUENZA  VACCINE  06/30/2022   COLONOSCOPY (Pts 45-25yr Insurance coverage will need to be confirmed)  12/26/2030      02/25/2021    8:49 AM 08/28/2021    8:52 AM 04/23/2022    8:23 AM 05/04/2022   10:24 AM 10/26/2022   10:21 AM  Fall Risk  Falls in the past year? 0 0 0 0 0  Was there an injury with Fall? 0 0 0 0 0  Fall Risk Category Calculator 0 0 0 0 0  Fall Risk Category _0   Patient Fall Risk Level _1   Patient at Risk for Falls Due to  No Fall Risks No Fall Risks No Fall Risks No Fall Risks  Fall risk Follow up  Falls evaluation completed;Education provided;Falls prevention discussed Falls evaluation completed Falls evaluation completed Falls evaluation completed   Functional Status Survey:  Vitals:   10/26/22 1017  BP: 102/82  Resp: 16  Temp: 97.9 F (36.6 C)  Weight: 157 lb (71.2 kg)  Height: _0  (1.727 m)   Body mass index is 23.87 kg/m. Physical Exam Vitals reviewed.  Constitutional:      General: He is not in acute distress.    Appearance: Normal appearance. He is normal weight. He is not ill-appearing or diaphoretic.  HENT:     Head: Normocephalic.     Right Ear: Tympanic membrane, ear canal and external ear normal. There is no impacted cerumen.     Left Ear: Tympanic membrane, ear canal and external ear normal. There is no impacted cerumen.     Nose: Nose normal. No congestion or rhinorrhea.     Mouth/Throat:     Mouth: Mucous membranes are moist.     Pharynx: Oropharynx is clear. No oropharyngeal exudate or posterior oropharyngeal erythema.  Eyes:     General: No scleral icterus.       Right eye: No discharge.        Left eye: No discharge.     Extraocular Movements: Extraocular movements intact.     Conjunctiva/sclera: Conjunctivae normal.     Pupils: Pupils are equal, round, and reactive to light.  Neck:     Vascular: No carotid bruit.  Cardiovascular:     Rate and Rhythm:  Normal rate and regular rhythm.     Pulses: Normal pulses.     Heart sounds: Normal heart sounds. No murmur heard.    No friction rub. No gallop.  Pulmonary:     Effort: Pulmonary effort is normal. No respiratory distress.     Breath sounds: Normal breath sounds. No wheezing, rhonchi or rales.  Chest:     Chest wall: No tenderness.  Abdominal:     General: Bowel sounds are normal. There is no distension.     Palpations: Abdomen is soft. There is no mass.     Tenderness: There is no abdominal tenderness. There is no right CVA tenderness, left CVA tenderness, guarding or rebound.  Musculoskeletal:        General: No swelling or tenderness. Normal range of motion.     Cervical back: Normal range of motion. No rigidity or tenderness.     Right lower leg: No edema.     Left lower leg: No edema.  Lymphadenopathy:     Cervical: No cervical adenopathy.  Skin:    General: Skin is warm and dry.     Coloration: Skin is not pale.     Findings: No bruising, erythema, lesion or rash.  Neurological:     Mental Status: He is alert. Mental status is at baseline.     Cranial Nerves: No cranial nerve deficit.     Sensory: No sensory deficit.     Motor: No weakness.     Coordination: Coordination abnormal.     Gait: Gait abnormal.  Psychiatric:        Mood and Affect: Mood normal.        Speech: Speech normal.        Behavior: Behavior normal.        Thought Content: Thought content normal.        Judgment: Judgment normal.     Labs reviewed: Recent Labs    04/23/22 0902  NA 140  K 4.7  CL 106  CO2 23  GLUCOSE 101*  BUN 17  CREATININE 1.31*  CALCIUM 9.8   Recent Labs  04/23/22 0902  AST 14  ALT 13  BILITOT 0.4  PROT 7.6   Recent Labs    04/23/22 0902  WBC 9.0  NEUTROABS 5,481  HGB 15.7  HCT 49.2  MCV 90.1  PLT 211   Lab Results  Component Value Date   TSH 1.64 04/23/2022   Lab Results  Component Value Date   HGBA1C 5.2 04/23/2022   Lab Results  Component  Value Date   CHOL 181 04/23/2022   HDL 68 04/23/2022   LDLCALC 92 04/23/2022   TRIG 111 04/23/2022   CHOLHDL 2.7 04/23/2022    Significant Diagnostic Results in last 30 days:  No results found.  Assessment/Plan 1. Decreased appetite Has improved on Remeron.Weight stable We will continue to monitor - mirtazapine (REMERON) 7.5 MG tablet; TAKE 1 TABLET(7.5 MG) BY MOUTH AT BEDTIME  Dispense: 90 tablet; Refill: 1  2. Insomnia, unspecified type Continue on mirtazapine - mirtazapine (REMERON) 7.5 MG tablet; TAKE 1 TABLET(7.5 MG) BY MOUTH AT BEDTIME  Dispense: 90 tablet; Refill: 1  3.Benign hypertension with CKD (chronic kidney disease) stage III (HCC) Blood pressure well controlled Continue on amlodipine 5 mg tablet daily Continue on statin for cardiovascular event prevention Not on aspirin due to advanced age and high risk for falls - amLODipine (NORVASC) 5 MG tablet; Take 1 tablet (5 mg total) by mouth daily.  Dispense: 90 tablet; Refill: 3 - Lipid panel; Future - TSH; Future - COMPLETE METABOLIC PANEL WITH GFR; Future - CBC with Differential/Platelet; Future  4. Depression with anxiety Mood stable Continue on sertraline - sertraline (ZOLOFT) 25 MG tablet; TAKE 1 TABLET(25 MG) BY MOUTH DAILY  Dispense: 90 tablet; Refill: 1  5. Need for influenza vaccination Afebrile Flut shot administered by CMA no acute reaction reported.  - Flu Vaccine QUAD High Dose(Fluad)  6. Hyperlipidemia LDL goal <100 LDL at goal Continue on atorvastatin 10 mg - Lipid panel; Future  Family/ staff Communication: Reviewed plan of care with patient and son verbalized understanding  Labs/tests ordered:  - CBC with Differential/Platelet - CMP with eGFR([facility]) - TSH - Lipid panel  Next Appointment : Return in about 6 months (around 04/26/2023) for fasting labs in one week.   Sandrea Hughs, NP

## 2022-10-26 NOTE — Patient Instructions (Signed)
-   Please get shingles and COVID-19 booster vaccine at the pharmacy  - please schedule appointment with a dentist to evaluate dental cavities  [facility] at [phone]  Address: [address] [place] [address]

## 2022-11-09 ENCOUNTER — Other Ambulatory Visit: Payer: Medicare Other

## 2022-11-09 ENCOUNTER — Ambulatory Visit: Payer: Medicare Other | Admitting: Family

## 2022-11-09 DIAGNOSIS — E785 Hyperlipidemia, unspecified: Secondary | ICD-10-CM

## 2022-11-09 DIAGNOSIS — I129 Hypertensive chronic kidney disease with stage 1 through stage 4 chronic kidney disease, or unspecified chronic kidney disease: Secondary | ICD-10-CM | POA: Diagnosis not present

## 2022-11-09 DIAGNOSIS — N183 Chronic kidney disease, stage 3 unspecified: Secondary | ICD-10-CM | POA: Diagnosis not present

## 2022-11-10 LAB — LIPID PANEL
Cholesterol: 152 mg/dL (ref <200)
HDL: 61 mg/dL (ref >=40)
LDL Cholesterol (Calc): 76 mg/dL (calc)
Non-HDL Cholesterol (Calc): 91 mg/dL (calc) (ref <130)
Total CHOL/HDL Ratio: 2.5 (calc) (ref <5.0)
Triglycerides: 74 mg/dL (ref <150)

## 2022-11-10 LAB — CBC WITH DIFFERENTIAL/PLATELET
Absolute Monocytes: 855 cells/uL (ref 200–950)
Basophils Absolute: 33 cells/uL (ref 0–200)
Basophils Relative: 0.4 %
Eosinophils Absolute: 50 cells/uL (ref 15–500)
Eosinophils Relative: 0.6 %
HCT: 43.7 % (ref 38.5–50.0)
Hemoglobin: 14 g/dL (ref 13.2–17.1)
Lymphs Abs: 3179 cells/uL (ref 850–3900)
MCH: 28 pg (ref 27.0–33.0)
MCHC: 32 g/dL (ref 32.0–36.0)
MCV: 87.4 fL (ref 80.0–100.0)
MPV: 11 fL (ref 7.5–12.5)
Monocytes Relative: 10.3 %
Neutro Abs: 4183 cells/uL (ref 1500–7800)
Neutrophils Relative %: 50.4 %
Platelets: 183 10*3/uL (ref 140–400)
RBC: 5 10*6/uL (ref 4.20–5.80)
RDW: 12 % (ref 11.0–15.0)
Total Lymphocyte: 38.3 %
WBC: 8.3 10*3/uL (ref 3.8–10.8)

## 2022-11-10 LAB — TSH: TSH: 2.84 mIU/L (ref 0.40–4.50)

## 2022-11-10 LAB — COMPLETE METABOLIC PANEL WITH GFR
AG Ratio: 1.6 (calc) (ref 1.0–2.5)
ALT: 24 U/L (ref 9–46)
AST: 17 U/L (ref 10–35)
Albumin: 4.4 g/dL (ref 3.6–5.1)
Alkaline phosphatase (APISO): 116 U/L (ref 35–144)
BUN/Creatinine Ratio: 13 (calc) (ref 6–22)
BUN: 17 mg/dL (ref 7–25)
CO2: 27 mmol/L (ref 20–32)
Calcium: 9.5 mg/dL (ref 8.6–10.3)
Chloride: 103 mmol/L (ref 98–110)
Creat: 1.34 mg/dL — ABNORMAL HIGH (ref 0.70–1.28)
Globulin: 2.8 g/dL (calc) (ref 1.9–3.7)
Glucose, Bld: 91 mg/dL (ref 65–99)
Potassium: 3.8 mmol/L (ref 3.5–5.3)
Sodium: 139 mmol/L (ref 135–146)
Total Bilirubin: 0.4 mg/dL (ref 0.2–1.2)
Total Protein: 7.2 g/dL (ref 6.1–8.1)
eGFR: 55 mL/min/{1.73_m2} — ABNORMAL LOW (ref >=60)

## 2022-11-13 ENCOUNTER — Ambulatory Visit: Payer: Medicare Other | Admitting: Family

## 2022-11-24 ENCOUNTER — Other Ambulatory Visit: Payer: Self-pay | Admitting: Family

## 2022-11-24 DIAGNOSIS — E785 Hyperlipidemia, unspecified: Secondary | ICD-10-CM

## 2023-01-28 NOTE — Telephone Encounter (Signed)
error 

## 2023-03-29 ENCOUNTER — Ambulatory Visit (INDEPENDENT_AMBULATORY_CARE_PROVIDER_SITE_OTHER): Payer: Medicare Other | Admitting: Family

## 2023-03-29 ENCOUNTER — Encounter: Payer: Self-pay | Admitting: Family

## 2023-03-29 DIAGNOSIS — Z0000 Encounter for general adult medical examination without abnormal findings: Secondary | ICD-10-CM | POA: Diagnosis not present

## 2023-03-29 DIAGNOSIS — Z87891 Personal history of nicotine dependence: Secondary | ICD-10-CM

## 2023-03-29 DIAGNOSIS — H538 Other visual disturbances: Secondary | ICD-10-CM | POA: Diagnosis not present

## 2023-03-29 DIAGNOSIS — H9193 Unspecified hearing loss, bilateral: Secondary | ICD-10-CM

## 2023-03-29 NOTE — Progress Notes (Addendum)
Subjective:   Alexander Pruitt is a 77 y.o. male who presents for Medicare Annual/Subsequent preventive examination.  Review of Systems    Cardiac Risk Factors include: advanced age (>34men, >61 women);male gender;hypertension;smoking/ tobacco exposure     Objective:    Today's Vitals   03/29/23 0932  PainSc: 8    There is no height or weight on file to calculate BMI.     03/29/2023    9:15 AM 10/26/2022   10:21 AM 05/04/2022   10:24 AM 04/23/2022    8:23 AM 02/25/2021    8:49 AM 01/27/2021   10:39 AM 09/20/2020   10:24 AM  Advanced Directives  Does Patient Have a Medical Advance Directive? Yes Yes No No No No No  Type of Estate agent of [place];Living will Healthcare Power of Attorney       Does patient want to make changes to medical advance directive? No - Patient declined No - Patient declined       Copy of Healthcare Power of Attorney in Chart? No - copy requested Yes - validated most recent copy scanned in chart (See row information)       Would patient like information on creating a medical advance directive?   No - Patient declined No - Patient declined No - Patient declined No - Patient declined No - Patient declined    Current Medications (verified) Outpatient Encounter Medications as of 03/29/2023  Medication Sig   acetaminophen (TYLENOL) 500 MG tablet Take 1 tablet (500 mg total) by mouth every 6 (six) hours as needed.   amLODipine (NORVASC) 5 MG tablet Take 1 tablet (5 mg total) by mouth daily.   atorvastatin (LIPITOR) 10 MG tablet TAKE 1 TABLET(10 MG) BY MOUTH DAILY   Emollient (CETAPHIL) cream Apply topically 2 (two) times daily. Both legs   hydrocortisone 1 % ointment Apply 1 application topically 2 (two) times daily.   mirtazapine (REMERON) 7.5 MG tablet TAKE 1 TABLET(7.5 MG) BY MOUTH AT BEDTIME   nicotine (NICODERM CQ) 14 mg/24hr patch Apply 14 mg Patch topically daily x 6 weeks then  Apply 7 mg patch Topically daily x 2 weeks then  stop Avoid smoking while on patch   sertraline (ZOLOFT) 25 MG tablet TAKE 1 TABLET(25 MG) BY MOUTH DAILY   vitamin B-12 (CYANOCOBALAMIN) 1000 MCG tablet Take 1 tablet (1,000 mcg total) by mouth daily.   No facility-administered encounter medications on file as of 03/29/2023.    Allergies (verified) Penicillins   History: Past Medical History:  Diagnosis Date   Alcoholic hepatitis    Anxiety associated with depression    Blurred vision    Gait disturbance    High blood pressure    Per [facility] New Patient Packet    High cholesterol     Per [facility] New Patient Packet    History of brain surgery    Lumbar radiculopathy    Lumbar radiculopathy    Nicotine addiction    Pain in leg, unspecified    Type 2 diabetes mellitus (HCC)    Vitamin D deficiency    Past Surgical History:  Procedure Laterality Date   BRAIN SURGERY     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    COLONOSCOPY     in [place] many yrs ago- he cannnot remember when or where    Family History  Problem Relation Age of Onset   Colon polyps Neg Hx    Colon cancer Neg Hx    Esophageal cancer  Neg Hx    Rectal cancer Neg Hx    Stomach cancer Neg Hx    Social History   Socioeconomic History   Marital status: Single    Spouse name: Not on file   Number of children: Not on file   Years of education: Not on file   Highest education level: Not on file  Occupational History   Not on file  Tobacco Use   Smoking status: Former    Types: Cigarettes   Smokeless tobacco: Never   Tobacco comments:    6-10 cigarettes daily   Vaping Use   Vaping Use: Never used  Substance and Sexual Activity   Alcohol use: Yes    Alcohol/week: 1.0 standard drink of alcohol    Types: 1 Cans of beer per week    Comment: once in a while.   Drug use: Not Currently    Types: Cocaine    Comment: 20 years ago.   Sexual activity: Not on file  Other Topics Concern   Not on file  Social History Narrative    Per [facility] New Patient Packet,  Abstracted on 06/07/2020       Diet: No      Caffeine: No      Married, if yes what year: Separated       Do you live in a house, apartment, assisted living, condo, trailer, ect: House       Is it one or more stories: One       Pets: Five      Current/Past profession: No      Highest level or education completed: 12th grade      Exercise:        No          Type and how often:          Living Will: No   DNR: No   POA/HPOA: No      Functional Status:   Do you have difficulty bathing or dressing yourself? Yes   Do you have difficulty preparing food or eating? Yes   Do you have difficulty managing your medications? Yes   Do you have difficulty managing your finances? Yes   Do you have difficulty affording your medications? Yes   Social Determinants of Health   Financial Resource Strain: Not on file  Food Insecurity: Not on file  Transportation Needs: Not on file  Physical Activity: Not on file  Stress: Not on file  Social Connections: Not on file    Tobacco Counseling Counseling given: Not Answered Tobacco comments: 6-10 cigarettes daily    Clinical Intake:  Pre-visit preparation completed: No  Pain : 0-10 Pain Score: 8  Pain Type: Chronic pain Pain Location: Knee (back) Pain Orientation: Right, Left Pain Radiating Towards: No Pain Descriptors / Indicators: Aching Pain Frequency: Intermittent Pain Relieving Factors: Tylenol,Topical Effect of Pain on Daily Activities: walking  Pain Relieving Factors: Tylenol,Topical  BMI - recorded: 23.87 Nutritional Status: BMI of 19-24  Normal Nutritional Risks: None Diabetes: No   Diabetic?No    Activities of Daily Living    03/29/2023    9:41 AM 03/29/2023    9:16 AM  In your present state of health, do you have any difficulty performing the following activities:  Hearing? 1 0  Comment sometimes puts the TV volume up   Vision? 1 0  Comment blurry sometimes   Difficulty concentrating or making decisions? 1  1  Comment Remembering   Walking or climbing stairs?  0 0  Dressing or bathing? 0 0  Doing errands, shopping? 1 0  Comment son Advice worker and eating ? N N  Using the Toilet? N N  In the past six months, have you accidently leaked urine? N N  Do you have problems with loss of bowel control? N N  Managing your Medications? N N  Managing your Finances? Y N  Comment son assist   Housekeeping or managing your Housekeeping? N N    Patient Care Team: Semaj Coburn, Donalee Citrin, NP as PCP - General (Family Medicine)  Indicate any recent Medical Services you may have received from other than [facility] providers in the past year (date may be approximate).     Assessment:   This is a routine wellness examination for [place].  Hearing/Vision screen No results found.  Dietary issues and exercise activities discussed: Current Exercise Habits: Home exercise routine, Type of exercise: walking, Time (Minutes): 20, Frequency (Times/Week): 2, Weekly Exercise (Minutes/Week): 40, Intensity: Mild, Exercise limited by: None identified   Goals Addressed             This Visit's Progress    Patient Stated       Exercise at least daily        Depression Screen    03/29/2023    9:17 AM 03/29/2023    9:16 AM 04/23/2022    9:07 AM  PHQ 2/9 Scores  PHQ - 2 Score 0 0 0  PHQ- 9 Score 0      Fall Risk    03/29/2023    9:16 AM 10/26/2022   10:21 AM 05/04/2022   10:24 AM 04/23/2022    8:23 AM 08/28/2021    8:52 AM  Fall Risk   Falls in the past year? 0 0 0 0 0  Number falls in past yr: 0 0 0 0 0  Injury with Fall? 0 0 0 0 0  Risk for fall due to : History of fall(s) No Fall Risks No Fall Risks No Fall Risks No Fall Risks  Follow up Falls evaluation completed;Education provided;Falls prevention discussed Falls evaluation completed Falls evaluation completed Falls evaluation completed Falls evaluation completed;Education provided;Falls prevention discussed    FALL RISK PREVENTION PERTAINING TO  THE HOME:  Any stairs in or around the home? Yes  If so, are there any without handrails? No  Home free of loose throw rugs in walkways, pet beds, electrical cords, etc? No  Adequate lighting in your home to reduce risk of falls? Yes   ASSISTIVE DEVICES UTILIZED TO PREVENT FALLS:  Life alert? No  Use of a cane, walker or w/c? Yes  Grab bars in the bathroom? Yes  Shower chair or bench in shower? No  Elevated toilet seat or a handicapped toilet? No   TIMED UP AND GO:  Was the test performed? No .  Length of time to ambulate 10 feet: N/A sec.   Gait slow and steady with assistive device  Cognitive Function:    06/25/2020    9:05 AM 06/11/2020    3:02 PM  MMSE - Mini Mental State Exam  Orientation to time 4 4  Orientation to time comments year: 2021, season: summer, date: 4th, day: tuesday, month: july.   Orientation to Place 2 2  Orientation to Place-comments state: [place], county: patient didn't know, city: [place], Facility: patient couldnt remember.   Registration 3 3  Attention/ Calculation 0 4  Attention/Calculation-comments patient spelled : WLORD, patient couldn't spell world backwards.  Recall 3 2  Language- name 2 objects 2 2  Language- repeat 0 1  Language- repeat-comments patient states: no if ands or but.   Language- follow 3 step command 3 3  Language- read & follow direction 1 1  Write a sentence 1 0  Copy design 0 0  Copy design-comments  failed the clock and sentence  Total score 19 22        03/29/2023    9:17 AM  6CIT Screen  What Year? 4 points  What month? 0 points  What time? 0 points  Count back from 20 2 points  Months in reverse 4 points  Repeat phrase 4 points  Total Score 14 points    Immunizations Immunization History  Administered Date(s) Administered   Fluad Quad(high Dose 65+) 09/20/2020, 08/28/2021, 10/26/2022   Moderna Sars-Covid-2 Vaccination 11/05/2020   Pneumococcal Conjugate-13 07/02/2009   Pneumococcal Polysaccharide-23  01/27/2021    TDAP status: Due, Education has been provided regarding the importance of this vaccine. Advised may receive this vaccine at local pharmacy or Health Dept. Aware to provide a copy of the vaccination record if obtained from local pharmacy or Health Dept. Verbalized acceptance and understanding.  Flu Vaccine status: Up to date  Pneumococcal vaccine status: Completed during today's visit.  Covid-19 vaccine status: Information provided on how to obtain vaccines.   Qualifies for Shingles Vaccine? Yes   Zostavax completed No   Shingrix Completed?: No.    Education has been provided regarding the importance of this vaccine. Patient has been advised to call insurance company to determine out of pocket expense if they have not yet received this vaccine. Advised may also receive vaccine at local pharmacy or Health Dept. Verbalized acceptance and understanding.  Screening Tests Health Maintenance  Topic Date Due   DTaP/Tdap/Td (1 - Tdap) Never done   COVID-19 Vaccine (2 - 2023-24 season) 04/14/2023 (Originally 07/31/2022)   Zoster Vaccines- Shingrix (1 of 2) 06/28/2023 (Originally 04/16/1997)   INFLUENZA VACCINE  07/01/2023   Medicare Annual Wellness (AWV)  03/28/2024   COLONOSCOPY (Pts 45-108yrs Insurance coverage will need to be confirmed)  12/26/2030   Pneumonia Vaccine 72+ Years old  Completed   Hepatitis C Screening  Completed   HPV VACCINES  Aged Out    Health Maintenance  Health Maintenance Due  Topic Date Due   DTaP/Tdap/Td (1 - Tdap) Never done    Colorectal cancer screening: Type of screening: Colonoscopy. Completed 12/26/2020. Repeat every 10 years  Lung Cancer Screening: (Low Dose CT Chest recommended if Age 50-80 years, 30 pack-year currently smoking OR have quit w/in 15years.) does qualify.   Lung Cancer Screening Referral: yes   Additional Screening:  Hepatitis C Screening: does qualify; Completed Yes   Vision Screening: Recommended annual ophthalmology exams  for early detection of glaucoma and other disorders of the eye. Is the patient up to date with their annual eye exam?  No  Who is the provider or what is the name of the office in which the patient attends annual eye exams? None will refer  If pt is not established with a provider, would they like to be referred to a provider to establish care? Yes .   Dental Screening: Recommended annual dental exams for proper oral hygiene  Community Resource Referral / Chronic Care Management: CRR required this visit?  No   CCM required this visit?  No    Plan:    I have personally reviewed and noted the following in the patient's  chart:   Medical and social history Use of alcohol, tobacco or illicit drugs  Current medications and supplements including opioid prescriptions. Patient is not currently taking opioid prescriptions. Functional ability and status Nutritional status Physical activity Advanced directives List of other physicians Hospitalizations, surgeries, and ER visits in previous 12 months Vitals Screenings to include cognitive, depression, and falls Referrals and appointments  In addition, I have reviewed and discussed with patient certain preventive protocols, quality metrics, and best practice recommendations. A written personalized care plan for preventive services as well as general preventive health recommendations were provided to patient.  Nurse Notes: Advised to get Tdap,Shingles and COVID-19 vaccine at the Pharmacy.  This service is provided via telemedicine  No vital signs collected/recorded due to the encounter was a telemedicine visit.   Location of patient (ex: home, work):  Home   Patient consents to a telephone visit:  yes  Location of the provider (ex: office, home):  [facility] office   Name of any referring provider:  Richarda Blade ,FNP - C   I connected with Joby Wint on 03/29/2023 by a video enabled telemedicine application and verified that I am speaking  with the correct person using two identifiers.   I discussed the limitations of evaluation and management by telemedicine. The patient expressed understanding and agreed to proceed.  Spent 27 minutes of face to face with patient  >50% time spent counseling; reviewing medical record; tests; labs; and developing future plan of care.  [name], NP   03/29/2023

## 2023-03-29 NOTE — Patient Instructions (Signed)
  Mr. Beckerman , Thank you for taking time to come for your Medicare Wellness Visit. I appreciate your ongoing commitment to your health goals. Please review the following plan we discussed and let me know if I can assist you in the future.   These are the goals we discussed:  Goals   None     This is a list of the screening recommended for you and due dates:  Health Maintenance  Topic Date Due   Medicare Annual Wellness Visit  Never done   DTaP/Tdap/Td vaccine (1 - Tdap) Never done   COVID-19 Vaccine (2 - 2023-24 season) 04/14/2023*   Zoster (Shingles) Vaccine (1 of 2) 06/28/2023*   Flu Shot  07/01/2023   Colon Cancer Screening  12/26/2030   Pneumonia Vaccine  Completed   Hepatitis C Screening: USPSTF Recommendation to screen - Ages 18-79 yo.  Completed   HPV Vaccine  Aged Out  *Topic was postponed. The date shown is not the original due date.

## 2023-04-12 ENCOUNTER — Encounter: Payer: Medicare Other | Admitting: Family

## 2023-04-12 NOTE — Progress Notes (Signed)
  This encounter was created in error - please disregard. No show 

## 2023-05-10 ENCOUNTER — Inpatient Hospital Stay: Admission: RE | Admit: 2023-05-10 | Payer: Medicare Other | Source: Ambulatory Visit

## 2023-06-09 ENCOUNTER — Inpatient Hospital Stay: Admission: RE | Admit: 2023-06-09 | Payer: Medicare Other | Source: Ambulatory Visit

## 2023-06-14 ENCOUNTER — Other Ambulatory Visit: Payer: Self-pay | Admitting: Family

## 2023-06-14 DIAGNOSIS — E785 Hyperlipidemia, unspecified: Secondary | ICD-10-CM

## 2023-06-22 ENCOUNTER — Observation Stay (HOSPITAL_COMMUNITY)
Admission: EM | Admit: 2023-06-22 | Discharge: 2023-06-24 | Disposition: A | Discharge status: Home / Self Care | Payer: Medicare Other | Attending: Internal Medicine | Admitting: Internal Medicine

## 2023-06-22 ENCOUNTER — Other Ambulatory Visit: Payer: Self-pay

## 2023-06-22 DIAGNOSIS — U071 COVID-19: Secondary | ICD-10-CM | POA: Diagnosis present

## 2023-06-22 DIAGNOSIS — E119 Type 2 diabetes mellitus without complications: Secondary | ICD-10-CM | POA: Diagnosis not present

## 2023-06-22 DIAGNOSIS — R231 Pallor: Secondary | ICD-10-CM | POA: Diagnosis not present

## 2023-06-22 DIAGNOSIS — R55 Syncope and collapse: Secondary | ICD-10-CM | POA: Diagnosis not present

## 2023-06-22 DIAGNOSIS — R000 Tachycardia, unspecified: Secondary | ICD-10-CM | POA: Diagnosis not present

## 2023-06-22 DIAGNOSIS — I10 Essential (primary) hypertension: Secondary | ICD-10-CM | POA: Diagnosis present

## 2023-06-22 DIAGNOSIS — N179 Acute kidney failure, unspecified: Principal | ICD-10-CM | POA: Diagnosis present

## 2023-06-22 DIAGNOSIS — R112 Nausea with vomiting, unspecified: Secondary | ICD-10-CM | POA: Diagnosis present

## 2023-06-22 DIAGNOSIS — Z79899 Other long term (current) drug therapy: Secondary | ICD-10-CM | POA: Insufficient documentation

## 2023-06-22 DIAGNOSIS — I959 Hypotension, unspecified: Secondary | ICD-10-CM | POA: Diagnosis not present

## 2023-06-22 DIAGNOSIS — R1031 Right lower quadrant pain: Secondary | ICD-10-CM | POA: Diagnosis not present

## 2023-06-22 DIAGNOSIS — I4510 Unspecified right bundle-branch block: Secondary | ICD-10-CM | POA: Diagnosis not present

## 2023-06-22 DIAGNOSIS — Z87891 Personal history of nicotine dependence: Secondary | ICD-10-CM | POA: Diagnosis not present

## 2023-06-22 DIAGNOSIS — N289 Disorder of kidney and ureter, unspecified: Secondary | ICD-10-CM | POA: Diagnosis not present

## 2023-06-22 DIAGNOSIS — E785 Hyperlipidemia, unspecified: Secondary | ICD-10-CM | POA: Diagnosis present

## 2023-06-22 MED ORDER — ONDANSETRON HCL 4 MG/2ML IJ SOLN
4.0000 mg | Freq: Once | INTRAMUSCULAR | Status: AC
  Administered 2023-06-22: 4 mg via INTRAVENOUS
  Filled 2023-06-22: qty 2

## 2023-06-22 MED ORDER — KETOROLAC TROMETHAMINE 15 MG/ML IJ SOLN
15.0000 mg | Freq: Once | INTRAMUSCULAR | Status: AC
  Administered 2023-06-22: 15 mg via INTRAVENOUS
  Filled 2023-06-22: qty 1

## 2023-06-22 MED ORDER — LACTATED RINGERS IV BOLUS
1000.0000 mL | Freq: Once | INTRAVENOUS | Status: AC
  Administered 2023-06-22: 1000 mL via INTRAVENOUS

## 2023-06-22 NOTE — ED Triage Notes (Signed)
Pt BIB [facility] from home d/t near syncope and had a fall - no injuries/ no pain.  .  Pt has N/V/D yesterday and has had dizziness.

## 2023-06-22 NOTE — ED Provider Notes (Signed)
[place] EMERGENCY DEPARTMENT AT [facility] Provider Note  CSN: [id number] Arrival date & time: 06/22/23 2248  Chief Complaint(s) Near Syncope  HPI Alexander Pruitt is a 77 y.o. male with PMH T2DM, PAD, mild cognitive impairment, lumbar radiculopathy who presents Emergency Department for evaluation of a syncopal episode and abdominal pain.  Patient states that he has had abdominal pain nausea and vomiting for the last 24 hours.  Worse in the right lower quadrant.  He took a bubble bath this evening, got out of the bath and sat on the toilet then had a syncopal episode.  Here in the emergency department, patient states that he does not feel lightheaded but does have persistent abdominal pain.  Denies chest pain, shortness of breath, headache, fever or other systemic symptoms.   Past Medical History Past Medical History:  Diagnosis Date   Alcoholic hepatitis    Anxiety associated with depression    Blurred vision    Gait disturbance    High blood pressure    Per [facility] New Patient Packet    High cholesterol     Per [facility] New Patient Packet    History of brain surgery    Lumbar radiculopathy    Lumbar radiculopathy    Nicotine addiction    Pain in leg, unspecified    Type 2 diabetes mellitus (HCC)    Vitamin D deficiency    Patient Active Problem List   Diagnosis Date Noted   Uncomplicated alcohol dependence (HCC) 04/23/2022   Peripheral arterial disease (HCC) 08/28/2021   Primary osteoarthritis of left hip 07/23/2020   Pain in left wrist 07/23/2020   Mild cognitive impairment 06/25/2020   Hyperlipidemia LDL goal <100 06/25/2020   Depression with anxiety 06/25/2020   Essential hypertension 06/25/2020   Hx of excision of tumor of brain meninges 06/25/2020   Chronic hip pain, right 06/25/2020   Home Medication(s) Prior to Admission medications   Medication Sig Start Date End Date Taking? Authorizing Provider  acetaminophen (TYLENOL) 500 MG tablet Take 1 tablet (500 mg  total) by mouth every 6 (six) hours as needed. 09/20/20   Reed, Tiffany L, DO  amLODipine (NORVASC) 5 MG tablet Take 1 tablet (5 mg total) by mouth daily. 10/26/22   Ngetich, Dinah C, NP  atorvastatin (LIPITOR) 10 MG tablet TAKE 1 TABLET(10 MG) BY MOUTH DAILY 06/14/23   Ngetich, Dinah C, NP  Emollient (CETAPHIL) cream Apply topically 2 (two) times daily. Both legs 04/23/22   Ngetich, Dinah C, NP  hydrocortisone 1 % ointment Apply 1 application topically 2 (two) times daily. 09/20/20   Ngetich, Dinah C, NP  mirtazapine (REMERON) 7.5 MG tablet TAKE 1 TABLET(7.5 MG) BY MOUTH AT BEDTIME 10/26/22   Ngetich, Dinah C, NP  nicotine (NICODERM CQ) 14 mg/24hr patch Apply 14 mg Patch topically daily x 6 weeks then  Apply 7 mg patch Topically daily x 2 weeks then stop Avoid smoking while on patch 04/23/22   Ngetich, Dinah C, NP  sertraline (ZOLOFT) 25 MG tablet TAKE 1 TABLET(25 MG) BY MOUTH DAILY 10/26/22   Ngetich, Dinah C, NP  vitamin B-12 (CYANOCOBALAMIN) 1000 MCG tablet Take 1 tablet (1,000 mcg total) by mouth daily. 04/23/22   Ngetich, Donalee Citrin, NP  Past Surgical History Past Surgical History:  Procedure Laterality Date   BRAIN SURGERY     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    COLONOSCOPY     in [place] many yrs ago- he cannnot remember when or where    Family History Family History  Problem Relation Age of Onset   Colon polyps Neg Hx    Colon cancer Neg Hx    Esophageal cancer Neg Hx    Rectal cancer Neg Hx    Stomach cancer Neg Hx     Social History Social History   Tobacco Use   Smoking status: Former    Types: Cigarettes   Smokeless tobacco: Never   Tobacco comments:    6-10 cigarettes daily   Vaping Use   Vaping status: Never Used  Substance Use Topics   Alcohol use: Yes    Alcohol/week: 1.0 standard drink of alcohol    Types: 1 Cans of beer per  week    Comment: once in a while.   Drug use: Not Currently    Types: Cocaine    Comment: 20 years ago.   Allergies Penicillins  Review of Systems Review of Systems  Gastrointestinal:  Positive for abdominal pain, diarrhea and vomiting.  Neurological:  Positive for syncope.    Physical Exam Vital Signs  I have reviewed the triage vital signs BP 131/85   Pulse 77   Temp (!) 97.5 F (36.4 C)   Resp 18   Ht 5\' 8"  (1.727 m)   Wt 72.6 kg   SpO2 100%   BMI 24.33 kg/m   Physical Exam Constitutional:      General: He is not in acute distress.    Appearance: Normal appearance.  HENT:     Head: Normocephalic and atraumatic.     Nose: No congestion or rhinorrhea.  Eyes:     General:        Right eye: No discharge.        Left eye: No discharge.     Extraocular Movements: Extraocular movements intact.     Pupils: Pupils are equal, round, and reactive to light.  Cardiovascular:     Rate and Rhythm: Normal rate and regular rhythm.     Heart sounds: No murmur heard. Pulmonary:     Effort: No respiratory distress.     Breath sounds: No wheezing or rales.  Abdominal:     General: There is no distension.     Tenderness: There is abdominal tenderness.  Musculoskeletal:        General: Normal range of motion.     Cervical back: Normal range of motion.  Skin:    General: Skin is warm and dry.  Neurological:     General: No focal deficit present.     Mental Status: He is alert.     ED Results and Treatments Labs (all labs ordered are listed, but only abnormal results are displayed) Labs Reviewed  COMPREHENSIVE METABOLIC PANEL  CBC WITH DIFFERENTIAL/PLATELET  URINALYSIS, ROUTINE W REFLEX MICROSCOPIC  LIPASE, BLOOD  TROPONIN I (HIGH SENSITIVITY)  Radiology No results found.  Pertinent labs & imaging results that were available during my care of  the patient were reviewed by me and considered in my medical decision making (see MDM for details).  Medications Ordered in ED Medications  ketorolac (TORADOL) 15 MG/ML injection 15 mg (has no administration in time range)  ondansetron (ZOFRAN) injection 4 mg (has no administration in time range)  lactated ringers bolus 1,000 mL (1,000 mLs Intravenous New Bag/Given 06/22/23 2328)                                                                                                                                     Procedures Procedures  (including critical care time)  Medical Decision Making / ED Course   This patient presents to the ED for concern of ***, this involves an extensive number of treatment options, and is a complaint that carries with it a high risk of complications and morbidity.  The differential diagnosis includes ***  MDM: ***   Additional history obtained: -Additional history obtained from *** -External records from outside source obtained and reviewed including: Chart review including previous notes, labs, imaging, consultation notes   Lab Tests: -I ordered, reviewed, and interpreted labs.   The pertinent results include:   Labs Reviewed  COMPREHENSIVE METABOLIC PANEL  CBC WITH DIFFERENTIAL/PLATELET  URINALYSIS, ROUTINE W REFLEX MICROSCOPIC  LIPASE, BLOOD  TROPONIN I (HIGH SENSITIVITY)      EKG ***  EKG Interpretation Date/Time:    Ventricular Rate:    PR Interval:    QRS Duration:    QT Interval:    QTC Calculation:   R Axis:      Text Interpretation:           Imaging Studies ordered: I ordered imaging studies including *** I independently visualized and interpreted imaging. I agree with the radiologist interpretation   Medicines ordered and prescription drug management: Meds ordered this encounter  Medications   lactated ringers bolus 1,000 mL   ketorolac (TORADOL) 15 MG/ML injection 15 mg   ondansetron (ZOFRAN) injection 4 mg     -I have reviewed the patients home medicines and have made adjustments as needed  Critical interventions ***  Consultations Obtained: I requested consultation with the ***,  and discussed lab and imaging findings as well as pertinent plan - they recommend: ***   Cardiac Monitoring: The patient was maintained on a cardiac monitor.  I personally viewed and interpreted the cardiac monitored which showed an underlying rhythm of: ***  Social Determinants of Health:  Factors impacting patients care include: ***   Reevaluation: After the interventions noted above, I reevaluated the patient and found that they have :{resolved/improved/worsened:23923::"improved"}  Co morbidities that complicate the patient evaluation  Past Medical History:  Diagnosis Date   Alcoholic hepatitis    Anxiety associated with depression    Blurred vision    Gait disturbance    High blood  pressure    Per [facility] New Patient Packet    High cholesterol     Per [facility] New Patient Packet    History of brain surgery    Lumbar radiculopathy    Lumbar radiculopathy    Nicotine addiction    Pain in leg, unspecified    Type 2 diabetes mellitus (HCC)    Vitamin D deficiency       Dispostion: I considered admission for this patient, ***     Final Clinical Impression(s) / ED Diagnoses Final diagnoses:  None     @PCDICTATION @

## 2023-06-23 ENCOUNTER — Emergency Department (HOSPITAL_COMMUNITY): Payer: Medicare Other

## 2023-06-23 ENCOUNTER — Observation Stay (HOSPITAL_COMMUNITY): Payer: Medicare Other

## 2023-06-23 ENCOUNTER — Encounter (HOSPITAL_COMMUNITY): Payer: Self-pay | Admitting: Internal Medicine

## 2023-06-23 DIAGNOSIS — E785 Hyperlipidemia, unspecified: Secondary | ICD-10-CM | POA: Diagnosis not present

## 2023-06-23 DIAGNOSIS — U071 COVID-19: Secondary | ICD-10-CM | POA: Diagnosis not present

## 2023-06-23 DIAGNOSIS — N281 Cyst of kidney, acquired: Secondary | ICD-10-CM | POA: Diagnosis not present

## 2023-06-23 DIAGNOSIS — N179 Acute kidney failure, unspecified: Secondary | ICD-10-CM | POA: Diagnosis present

## 2023-06-23 DIAGNOSIS — I10 Essential (primary) hypertension: Secondary | ICD-10-CM

## 2023-06-23 DIAGNOSIS — R1031 Right lower quadrant pain: Secondary | ICD-10-CM | POA: Diagnosis not present

## 2023-06-23 DIAGNOSIS — R112 Nausea with vomiting, unspecified: Secondary | ICD-10-CM | POA: Diagnosis not present

## 2023-06-23 DIAGNOSIS — N289 Disorder of kidney and ureter, unspecified: Secondary | ICD-10-CM | POA: Diagnosis not present

## 2023-06-23 LAB — COMPREHENSIVE METABOLIC PANEL
ALT: 18 U/L (ref 0–44)
AST: 26 U/L (ref 15–41)
Albumin: 3.9 g/dL (ref 3.5–5.0)
Alkaline Phosphatase: 80 U/L (ref 38–126)
Anion gap: 15 (ref 5–15)
BUN: 22 mg/dL (ref 8–23)
CO2: 22 mmol/L (ref 22–32)
Calcium: 8.6 mg/dL — ABNORMAL LOW (ref 8.9–10.3)
Chloride: 97 mmol/L — ABNORMAL LOW (ref 98–111)
Creatinine, Ser: 2.33 mg/dL — ABNORMAL HIGH (ref 0.61–1.24)
GFR, Estimated: 28 mL/min — ABNORMAL LOW (ref >60)
Glucose, Bld: 152 mg/dL — ABNORMAL HIGH (ref 70–99)
Potassium: 4.6 mmol/L (ref 3.5–5.1)
Sodium: 134 mmol/L — ABNORMAL LOW (ref 135–145)
Total Bilirubin: 0.2 mg/dL — ABNORMAL LOW (ref 0.3–1.2)
Total Protein: 7.2 g/dL (ref 6.5–8.1)

## 2023-06-23 LAB — URINALYSIS, ROUTINE W REFLEX MICROSCOPIC
Bilirubin Urine: NEGATIVE
Glucose, UA: NEGATIVE mg/dL
Hgb urine dipstick: NEGATIVE
Ketones, ur: NEGATIVE mg/dL
Leukocytes,Ua: NEGATIVE
Nitrite: NEGATIVE
Protein, ur: 100 mg/dL — AB
Specific Gravity, Urine: 1.02 (ref 1.005–1.030)
pH: 5 (ref 5.0–8.0)

## 2023-06-23 LAB — RESP PANEL BY RT-PCR (RSV, FLU A&B, COVID)  RVPGX2
Influenza A by PCR: NEGATIVE
Influenza B by PCR: NEGATIVE
Resp Syncytial Virus by PCR: NEGATIVE
SARS Coronavirus 2 by RT PCR: POSITIVE — AB

## 2023-06-23 LAB — RENAL FUNCTION PANEL
Albumin: 3.2 g/dL — ABNORMAL LOW (ref 3.5–5.0)
Anion gap: 16 — ABNORMAL HIGH (ref 5–15)
BUN: 20 mg/dL (ref 8–23)
CO2: 21 mmol/L — ABNORMAL LOW (ref 22–32)
Calcium: 7.8 mg/dL — ABNORMAL LOW (ref 8.9–10.3)
Chloride: 99 mmol/L (ref 98–111)
Creatinine, Ser: 1.86 mg/dL — ABNORMAL HIGH (ref 0.61–1.24)
GFR, Estimated: 37 mL/min — ABNORMAL LOW (ref >60)
Glucose, Bld: 114 mg/dL — ABNORMAL HIGH (ref 70–99)
Phosphorus: 2.7 mg/dL (ref 2.5–4.6)
Potassium: 3.6 mmol/L (ref 3.5–5.1)
Sodium: 136 mmol/L (ref 135–145)

## 2023-06-23 LAB — URINALYSIS, COMPLETE (UACMP) WITH MICROSCOPIC
Bacteria, UA: NONE SEEN
Bilirubin Urine: NEGATIVE
Glucose, UA: NEGATIVE mg/dL
Ketones, ur: NEGATIVE mg/dL
Leukocytes,Ua: NEGATIVE
Nitrite: NEGATIVE
Protein, ur: 30 mg/dL — AB
Specific Gravity, Urine: 1.008 (ref 1.005–1.030)
pH: 5 (ref 5.0–8.0)

## 2023-06-23 LAB — CBC WITH DIFFERENTIAL/PLATELET
Abs Immature Granulocytes: 0.03 10*3/uL (ref 0.00–0.07)
Basophils Absolute: 0 10*3/uL (ref 0.0–0.1)
Basophils Relative: 0 %
Eosinophils Absolute: 0 10*3/uL (ref 0.0–0.5)
Eosinophils Relative: 0 %
HCT: 50.3 % (ref 39.0–52.0)
Hemoglobin: 15.5 g/dL (ref 13.0–17.0)
Immature Granulocytes: 0 %
Lymphocytes Relative: 15 %
Lymphs Abs: 1.5 10*3/uL (ref 0.7–4.0)
MCH: 28.1 pg (ref 26.0–34.0)
MCHC: 30.8 g/dL (ref 30.0–36.0)
MCV: 91.3 fL (ref 80.0–100.0)
Monocytes Absolute: 1.1 10*3/uL — ABNORMAL HIGH (ref 0.1–1.0)
Monocytes Relative: 12 %
Neutro Abs: 7 10*3/uL (ref 1.7–7.7)
Neutrophils Relative %: 73 %
Platelets: 168 10*3/uL (ref 150–400)
RBC: 5.51 MIL/uL (ref 4.22–5.81)
RDW: 14.2 % (ref 11.5–15.5)
WBC: 9.7 10*3/uL (ref 4.0–10.5)
nRBC: 0 % (ref 0.0–0.2)

## 2023-06-23 LAB — TROPONIN I (HIGH SENSITIVITY)
Troponin I (High Sensitivity): 6 ng/L (ref <18)
Troponin I (High Sensitivity): 8 ng/L (ref <18)

## 2023-06-23 LAB — LIPASE, BLOOD: Lipase: 30 U/L (ref 11–51)

## 2023-06-23 MED ORDER — LACTATED RINGERS IV SOLN: INTRAVENOUS | Status: DC

## 2023-06-23 MED ORDER — ENOXAPARIN SODIUM 40 MG/0.4ML IJ SOSY
40.0000 mg | PREFILLED_SYRINGE | INTRAMUSCULAR | Status: DC
  Administered 2023-06-23 – 2023-06-24 (×2): 40 mg via SUBCUTANEOUS
  Filled 2023-06-23 (×2): qty 0.4

## 2023-06-23 MED ORDER — MIRTAZAPINE 15 MG PO TABS
7.5000 mg | ORAL_TABLET | Freq: Every day | ORAL | Status: DC
  Administered 2023-06-23: 7.5 mg via ORAL
  Filled 2023-06-23: qty 1

## 2023-06-23 MED ORDER — PHENOL 1.4 % MT LIQD
1.0000 | OROMUCOSAL | Status: DC | PRN
  Administered 2023-06-24: 1 via OROMUCOSAL
  Filled 2023-06-23: qty 177

## 2023-06-23 MED ORDER — ACETAMINOPHEN 650 MG RE SUPP: 650.0000 mg | Freq: Four times a day (QID) | RECTAL | Status: DC | PRN

## 2023-06-23 MED ORDER — LACTATED RINGERS IV BOLUS
1000.0000 mL | Freq: Once | INTRAVENOUS | Status: AC
  Administered 2023-06-23: 1000 mL via INTRAVENOUS

## 2023-06-23 MED ORDER — AMLODIPINE BESYLATE 5 MG PO TABS
5.0000 mg | ORAL_TABLET | Freq: Every day | ORAL | Status: DC
  Administered 2023-06-23 – 2023-06-24 (×2): 5 mg via ORAL
  Filled 2023-06-23 (×2): qty 1

## 2023-06-23 MED ORDER — ONDANSETRON HCL 4 MG PO TABS: 4.0000 mg | ORAL_TABLET | Freq: Four times a day (QID) | ORAL | Status: DC | PRN

## 2023-06-23 MED ORDER — ACETAMINOPHEN 325 MG PO TABS: 650.0000 mg | ORAL_TABLET | Freq: Four times a day (QID) | ORAL | Status: DC | PRN

## 2023-06-23 MED ORDER — ATORVASTATIN CALCIUM 10 MG PO TABS
10.0000 mg | ORAL_TABLET | Freq: Every day | ORAL | Status: DC
  Administered 2023-06-23 – 2023-06-24 (×2): 10 mg via ORAL
  Filled 2023-06-23 (×2): qty 1

## 2023-06-23 MED ORDER — DICYCLOMINE HCL 10 MG PO CAPS
20.0000 mg | ORAL_CAPSULE | Freq: Once | ORAL | Status: AC
  Administered 2023-06-23: 20 mg via ORAL
  Filled 2023-06-23: qty 2

## 2023-06-23 MED ORDER — ONDANSETRON HCL 4 MG/2ML IJ SOLN: 4.0000 mg | Freq: Four times a day (QID) | INTRAMUSCULAR | Status: DC | PRN

## 2023-06-23 MED ORDER — GUAIFENESIN 100 MG/5ML PO LIQD
5.0000 mL | ORAL | Status: DC | PRN
  Administered 2023-06-23: 5 mL via ORAL
  Filled 2023-06-23: qty 15

## 2023-06-23 NOTE — Assessment & Plan Note (Signed)
Cont amlodipine 

## 2023-06-23 NOTE — Progress Notes (Signed)
Pt admitted from the ED for syncopal episode, and decreased PO intake due to GI symptoms.  He is also positive for Covid and will be on Contact Airborne precautions per MD's orders.  Pt was transported via gurney accompanied by a transporter. Assisted pt into his room and bed. Pt was observed to have a steady gait when ambulating. Reviewed POC with pt and introduced him to his surroundings. Obtained VS which were WNL except for his BP. Answered any pending questions. Pt had no further questions. Instructed pt to utilize RN call light for assistance. Hourly rounds performed. Bed in lowest position, locked with two upper side rails engaged. Belongings and call light within reach

## 2023-06-23 NOTE — Assessment & Plan Note (Signed)
Primarily GI symptoms Has cough, but satting 100% on room air currently. Paxlovid considered, however: Pt takes a statin chronically and Patients symptoms are primarily GI, and paxlovid's side effects are primarily GI Therefore will hold off on starting this for the moment

## 2023-06-23 NOTE — Assessment & Plan Note (Signed)
Cont statin

## 2023-06-23 NOTE — ED Notes (Signed)
ED TO INPATIENT HANDOFF REPORT  ED Nurse Name and Phone #: Alexander Pruitt/ [phone]  S Name/Age/Gender Alexander Pruitt 77 y.o. male Room/Bed: 001C/001C  Code Status   Code Status: Full Code  Home/SNF/Other Home Patient oriented to: self, place, time, and situation Is this baseline? Yes   Triage Complete: Triage complete  Chief Complaint AKI (acute kidney injury) (HCC) [N17.9]  Triage Note Pt BIB [facility] from home d/t near syncope and had a fall - no injuries/ no pain.  .  Pt has N/V/D yesterday and has had dizziness.   Allergies Allergies  Allergen Reactions   Penicillins Other (See Comments)    Makes pt hurt all over     Level of Care/Admitting Diagnosis ED Disposition     ED Disposition  Admit   Condition  --   Comment  Hospital Area: [facility] [[id number]]  Level of Care: Med-Surg [16]  May place patient in observation at [facility] or [facility] if equivalent level of care is available:: No  Covid Evaluation: Confirmed COVID Positive  Diagnosis: AKI (acute kidney injury) [facility]) [[id number]]  Admitting Physician: Hillary Bow [id number]  Attending Physician: Hillary Bow [phone]          B Medical/Surgery History Past Medical History:  Diagnosis Date   Alcoholic hepatitis    Anxiety associated with depression    Blurred vision    Gait disturbance    High blood pressure    Per [facility] New Patient Packet    High cholesterol     Per [facility] New Patient Packet    History of brain surgery    Lumbar radiculopathy    Lumbar radiculopathy    Nicotine addiction    Pain in leg, unspecified    Type 2 diabetes mellitus (HCC)    Vitamin D deficiency    Past Surgical History:  Procedure Laterality Date   BRAIN SURGERY     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    COLONOSCOPY     in [place] many yrs ago- he cannnot remember when or where      A IV Location/Drains/Wounds Patient Lines/Drains/Airways Status     Active Line/Drains/Airways      Name Placement date Placement time Site Days   Peripheral IV 06/22/23 20 G 1" Anterior;Distal;Right;Upper Arm 06/22/23  2222  Arm  1            Intake/Output Last 24 hours No intake or output data in the 24 hours ending 06/23/23 0551  Labs/Imaging Results for orders placed or performed during the hospital encounter of 06/22/23 (from the past 48 hour(s))  Comprehensive metabolic panel     Status: Abnormal   Collection Time: 06/22/23 11:05 PM  Result Value Ref Range   Sodium 134 (L) 135 - 145 mmol/L   Potassium 4.6 3.5 - 5.1 mmol/L    Comment: HEMOLYSIS AT THIS LEVEL MAY AFFECT RESULT   Chloride 97 (L) 98 - 111 mmol/L   CO2 22 22 - 32 mmol/L   Glucose, Bld 152 (H) 70 - 99 mg/dL    Comment: Glucose reference range applies only to samples taken after fasting for at least 8 hours.   BUN 22 8 - 23 mg/dL   Creatinine, Ser 3.32 (H) 0.61 - 1.24 mg/dL   Calcium 8.6 (L) 8.9 - 10.3 mg/dL   Total Protein 7.2 6.5 - 8.1 g/dL   Albumin 3.9 3.5 - 5.0 g/dL   AST 26 15 - 41 U/L  Comment: HEMOLYSIS AT THIS LEVEL MAY AFFECT RESULT   ALT 18 0 - 44 U/L    Comment: HEMOLYSIS AT THIS LEVEL MAY AFFECT RESULT   Alkaline Phosphatase 80 38 - 126 U/L    Comment: HEMOLYSIS AT THIS LEVEL MAY AFFECT RESULT   Total Bilirubin 0.2 (L) 0.3 - 1.2 mg/dL    Comment: HEMOLYSIS AT THIS LEVEL MAY AFFECT RESULT   GFR, Estimated 28 (L) >60 mL/min    Comment: (NOTE) Calculated using the CKD-EPI Creatinine Equation (2021)    Anion gap 15 5 - 15    Comment: Performed at [facility], [address] [place], [place] [address]  Troponin I (High Sensitivity)     Status: None   Collection Time: 06/22/23 11:05 PM  Result Value Ref Range   Troponin I (High Sensitivity) 6 <18 ng/L    Comment: (NOTE) Elevated high sensitivity troponin I (hsTnI) values and significant  changes across serial measurements may suggest ACS but many other  chronic and acute conditions are known to elevate hsTnI results.  Refer to the  "Links" section for chest pain algorithms and additional  guidance. Performed at [facility], [address], [place], [place] [address]   CBC with Differential     Status: Abnormal   Collection Time: 06/22/23 11:05 PM  Result Value Ref Range   WBC 9.7 4.0 - 10.5 K/uL   RBC 5.51 4.22 - 5.81 MIL/uL   Hemoglobin 15.5 13.0 - 17.0 g/dL   HCT 30.8 65.7 - 84.6 %   MCV 91.3 80.0 - 100.0 fL   MCH 28.1 26.0 - 34.0 pg   MCHC 30.8 30.0 - 36.0 g/dL   RDW 96.2 95.2 - 84.1 %   Platelets 168 150 - 400 K/uL   nRBC 0.0 0.0 - 0.2 %   Neutrophils Relative % 73 %   Neutro Abs 7.0 1.7 - 7.7 K/uL   Lymphocytes Relative 15 %   Lymphs Abs 1.5 0.7 - 4.0 K/uL   Monocytes Relative 12 %   Monocytes Absolute 1.1 (H) 0.1 - 1.0 K/uL   Eosinophils Relative 0 %   Eosinophils Absolute 0.0 0.0 - 0.5 K/uL   Basophils Relative 0 %   Basophils Absolute 0.0 0.0 - 0.1 K/uL   Immature Granulocytes 0 %   Abs Immature Granulocytes 0.03 0.00 - 0.07 K/uL    Comment: Performed at [facility], [address] [place], [place] [address]  Urinalysis, Routine w reflex microscopic -Urine, Clean Catch     Status: Abnormal   Collection Time: 06/23/23 12:48 AM  Result Value Ref Range   Color, Urine YELLOW YELLOW   APPearance HAZY (A) CLEAR   Specific Gravity, Urine 1.020 1.005 - 1.030   pH 5.0 5.0 - 8.0   Glucose, UA NEGATIVE NEGATIVE mg/dL   Hgb urine dipstick NEGATIVE NEGATIVE   Bilirubin Urine NEGATIVE NEGATIVE   Ketones, ur NEGATIVE NEGATIVE mg/dL   Protein, ur 102 (A) NEGATIVE mg/dL   Nitrite NEGATIVE NEGATIVE   Leukocytes,Ua NEGATIVE NEGATIVE   RBC / HPF 0-5 0 - 5 RBC/hpf   WBC, UA 0-5 0 - 5 WBC/hpf   Bacteria, UA RARE (A) NONE SEEN   Squamous Epithelial / HPF 0-5 0 - 5 /HPF   Mucus PRESENT    Hyaline Casts, UA PRESENT     Comment: Performed at [facility], [address], [place], [place] [address]  Troponin I (High Sensitivity)     Status:  None   Collection Time: 06/23/23  1:34 AM   Result Value Ref Range   Troponin I (High Sensitivity) 8 <18 ng/L    Comment: (NOTE) Elevated high sensitivity troponin I (hsTnI) values and significant  changes across serial measurements may suggest ACS but many other  chronic and acute conditions are known to elevate hsTnI results.  Refer to the "Links" section for chest pain algorithms and additional  guidance. Performed at [facility], [address] [place], [place] [address]   Resp panel by RT-PCR (RSV, Flu A&B, Covid) Anterior Nasal Swab     Status: Abnormal   Collection Time: 06/23/23  1:34 AM   Specimen: Anterior Nasal Swab  Result Value Ref Range   SARS Coronavirus 2 by RT PCR POSITIVE (A) NEGATIVE   Influenza A by PCR NEGATIVE NEGATIVE   Influenza B by PCR NEGATIVE NEGATIVE    Comment: (NOTE) The Xpert Xpress SARS-CoV-2/FLU/RSV plus assay is intended as an aid in the diagnosis of influenza from Nasopharyngeal swab specimens and should not be used as a sole basis for treatment. Nasal washings and aspirates are unacceptable for Xpert Xpress SARS-CoV-2/FLU/RSV testing.  Fact Sheet for Patients: BloggerCourse.com  Fact Sheet for Healthcare Providers: SeriousBroker.it  This test is not yet approved or cleared by the [place] FDA and has been authorized for detection and/or diagnosis of SARS-CoV-2 by FDA under an Emergency Use Authorization (EUA). This EUA will remain in effect (meaning this test can be used) for the duration of the COVID-19 declaration under Section 564(b)(1) of the Act, 21 U.S.C. section 360bbb-3(b)(1), unless the authorization is terminated or revoked.     Resp Syncytial Virus by PCR NEGATIVE NEGATIVE    Comment: (NOTE) Fact Sheet for Patients: BloggerCourse.com  Fact Sheet for Healthcare Providers: SeriousBroker.it  This test is not yet approved or cleared by the [place] FDA  and has been authorized for detection and/or diagnosis of SARS-CoV-2 by FDA under an Emergency Use Authorization (EUA). This EUA will remain in effect (meaning this test can be used) for the duration of the COVID-19 declaration under Section 564(b)(1) of the Act, 21 U.S.C. section 360bbb-3(b)(1), unless the authorization is terminated or revoked.  Performed at [facility], [address], [place], [place] [address]   Renal function panel     Status: Abnormal   Collection Time: 06/23/23  3:02 AM  Result Value Ref Range   Sodium 136 135 - 145 mmol/L   Potassium 3.6 3.5 - 5.1 mmol/L   Chloride 99 98 - 111 mmol/L   CO2 21 (L) 22 - 32 mmol/L   Glucose, Bld 114 (H) 70 - 99 mg/dL    Comment: Glucose reference range applies only to samples taken after fasting for at least 8 hours.   BUN 20 8 - 23 mg/dL   Creatinine, Ser 0.86 (H) 0.61 - 1.24 mg/dL   Calcium 7.8 (L) 8.9 - 10.3 mg/dL   Phosphorus 2.7 2.5 - 4.6 mg/dL   Albumin 3.2 (L) 3.5 - 5.0 g/dL   GFR, Estimated 37 (L) >60 mL/min    Comment: (NOTE) Calculated using the CKD-EPI Creatinine Equation (2021)    Anion gap 16 (H) 5 - 15    Comment: Performed at [facility], [address] [place], [place] [address]   CT ABDOMEN PELVIS WO CONTRAST  Result Date: 06/23/2023 CLINICAL DATA:  Right lower quadrant pain EXAM: CT ABDOMEN AND PELVIS WITHOUT CONTRAST TECHNIQUE: Multidetector CT imaging of the abdomen  and pelvis was performed following the standard protocol without IV contrast. RADIATION DOSE REDUCTION: This exam was performed according to the departmental dose-optimization program which includes automated exposure control, adjustment of the mA and/or kV according to patient size and/or use of iterative reconstruction technique. COMPARISON:  None Available. FINDINGS: Lower chest: Linear scarring in the lung bases.  No effusions. Hepatobiliary: No focal hepatic abnormality. Gallbladder unremarkable. Pancreas: No focal  abnormality or ductal dilatation. Spleen: No focal abnormality.  Normal size. Adrenals/Urinary Tract: Small hyperdense exophytic lesions off the left kidney, the largest 11 mm. These likely reflect hemorrhagic cysts. No stones or hydronephrosis. Adrenal glands and urinary bladder unremarkable. Stomach/Bowel: Appendix not visualized. No pericecal inflammation. Stomach, large and small bowel grossly unremarkable. Vascular/Lymphatic: Aortic atherosclerosis. No evidence of aneurysm or adenopathy. Reproductive: Prostate enlargement Other: No free fluid or free air. Musculoskeletal: No acute bony abnormality. IMPRESSION: Nonvisualization of the appendix. No visible pericecal inflammation. Small hyperdense lesions within the left kidney measuring up to 11 mm, most likely hemorrhagic cysts. This could be confirmed with nonemergent elective renal ultrasound. Aortic atherosclerosis. Prostate enlargement. No acute findings. Electronically Signed   By: Charlett Nose M.D.   On: 06/23/2023 01:25    Pending Labs Unresulted Labs (From admission, onward)     Start     Ordered   06/24/23 0500  CBC  Tomorrow morning,   R        06/23/23 0522   06/24/23 0500  Basic metabolic panel  Tomorrow morning,   R        06/23/23 0522   06/22/23 2326  Lipase, blood  Once,   STAT        06/22/23 2325            Vitals/Pain Today's Vitals   06/23/23 0015 06/23/23 0200 06/23/23 0239 06/23/23 0515  BP: (!) 147/71 (!) 161/85  (!) 143/78  Pulse: 74 73  74  Resp: 17 13  15   Temp:   98 F (36.7 C)   SpO2: 96% 100%  98%  Weight:      Height:      PainSc:        Isolation Precautions No active isolations  Medications Medications  lactated ringers infusion ( Intravenous New Bag/Given 06/23/23 0448)  enoxaparin (LOVENOX) injection 40 mg (has no administration in time range)  acetaminophen (TYLENOL) tablet 650 mg (has no administration in time range)    Or  acetaminophen (TYLENOL) suppository 650 mg (has no  administration in time range)  ondansetron (ZOFRAN) tablet 4 mg (has no administration in time range)    Or  ondansetron (ZOFRAN) injection 4 mg (has no administration in time range)  lactated ringers bolus 1,000 mL (0 mLs Intravenous Stopped 06/23/23 0102)  ketorolac (TORADOL) 15 MG/ML injection 15 mg (15 mg Intravenous Given 06/22/23 2351)  ondansetron (ZOFRAN) injection 4 mg (4 mg Intravenous Given 06/22/23 2352)  lactated ringers bolus 1,000 mL (0 mLs Intravenous Stopped 06/23/23 0301)  dicyclomine (BENTYL) capsule 20 mg (20 mg Oral Given 06/23/23 0301)    Mobility walks     Focused Assessments Neuro Assessment Handoff:  Swallow screen pass? Yes          Neuro Assessment: Within Defined Limits Neuro Checks:      Has TPA been given? No If patient is a Neuro Trauma and patient is going to OR before floor call report to 4N Charge nurse: [phone] or [phone]   R Recommendations: See Admitting Provider Note  Report given to:  Additional Notes:

## 2023-06-23 NOTE — Plan of Care (Signed)
Problem: Education: Goal: Knowledge of General Education information will improve Description: Including pain rating scale, medication(s)/side effects and non-pharmacologic comfort measures Outcome: Progressing Pt admitted from the ED for for syncopal episode, and decreased PO intake due to GI symptoms.  He is also positive for Covid and will be on Contact Airborne precautions per MD's orders.      Problem: Clinical Measurements: Goal: Ability to maintain clinical measurements within normal limits will improve Outcome: Progressing Pt's VS WNL thus far.        Problem: Clinical Measurements: Goal: Will remain free from infection Outcome: Progressing S/Sx of infection monitored and assessed q-shift.  Pt has remained afebrile thus far.  He was admitted from the ED for for syncopal episode, and decreased PO intake due to GI symptoms.  Pt is also positive for Covid and will be on Contact Airborne precautions per MD's orders.        Problem: Clinical Measurements: Goal: Respiratory complications will improve Outcome: Progressing Respiratory status monitored and assessed q-shift.  Pt is on room air with O2 at 100% and respiration rate of 18 breaths per minute.  He has not endorsed c/o SOB or DOE.     Problem: Activity: Goal: Risk for activity intolerance will decrease Outcome: Progressing Pt is independent of all his ADLs.  He was observed OOB ambulating in his room with a steady gait.    Problem: Nutrition: Goal: Adequate nutrition will be maintained Outcome: Progressing Pt is on a regular diet.  He has been able to tolerate her diet thus far w/o s/sx of abdominal pain/ distention or n/v.     Problem: Elimination: Goal: Will not experience complications related to bowel motility Outcome: Progressing Pt stated his LBM was on 06/22/2023.  He does not endorse c/o constipation or abdominal pain/ distention.  Per MD's order he is on a bowel regiment.    Problem: Elimination: Goal: Will not  experience complications related to urinary retention Outcome: Progressing Pt is continent of his bladder.  He utilizes an urinal to capture his urine.    Problem: Safety: Goal: Ability to remain free from injury will improve Outcome: Progressing Pt has remained free from falls thus far.  Instructed pt to utilize RN call light for assistance.  Hourly rounds performed.  Bed in lowest position, locked with two upper side rails engaged.  Belongings and call light within reach.              Problem: Skin Integrity: Goal: Risk for impaired skin integrity will decrease Outcome: Progressing Skin integrity monitored and assessed q-shift.  Instructed pt to turn q2 hours to prevent further skin impairment.  Tubes and drains assessed for device related pressure sores.  Pt is continent of both his bowel and bladder.

## 2023-06-23 NOTE — Assessment & Plan Note (Addendum)
Likely pre-renal / ATN in setting of COVID-19 infection with predominantly GI symptoms causing decreased PO intake. IVF: 2L in ED followed by 100 cc/hr Strict intake and output CT AP was negative for obstruction or acute finding: Just lesions in R kidney likely cysts and recd non emergent [place]. Have ordered renal US UA ordered and pending Repeat BMP tomorrow AM

## 2023-06-23 NOTE — Assessment & Plan Note (Addendum)
N/V and abd pain likely due to COVID-19 viral infection. PRN zofran CT without acute findings

## 2023-06-23 NOTE — Progress Notes (Signed)
Patient admitted after midnight, please see H&P.  Here with COVID GI symptoms and AKI.  Hope for d/c home in the AM. Marlin Canary DO

## 2023-06-23 NOTE — H&P (Signed)
History and Physical    Patient: Alexander Pruitt NGE:[id number] DOB: 08/07/1946 DOA: 06/22/2023 DOS: the patient was seen and examined on 06/23/2023 PCP: Ngetich, Donalee Citrin, NP  Patient coming from: Home  Chief Complaint:  Chief Complaint  Patient presents with   Near Syncope   HPI: Alexander Pruitt is a 77 y.o. male with medical history significant of HTN, HLD, MCI.  Pt presents to ED for evaluation of syncopal episode.  Pt with abd pain, N/V for past 24h.  Worse in RLQ.  Took bath this evening, after getting out of bath he sat on toilet and had syncopal episode.  Admits decreased PO intake due to GI symptoms.  No CP, SOB, headache, fever.  Does have non-productive cough over same time period.  Pt quit smoking 1 year ago he states.  PMH lists h/o EtOH dependence and alcoholic hepatitis; however, pt patient denies ever drinking heavily in past to me.  Review of Systems: As mentioned in the history of present illness. All other systems reviewed and are negative. Past Medical History:  Diagnosis Date   Alcoholic hepatitis    Anxiety associated with depression    Blurred vision    Gait disturbance    High blood pressure    Per [facility] New Patient Packet    High cholesterol     Per [facility] New Patient Packet    History of brain surgery    Lumbar radiculopathy    Lumbar radiculopathy    Nicotine addiction    Pain in leg, unspecified    Type 2 diabetes mellitus (HCC)    Vitamin D deficiency    Past Surgical History:  Procedure Laterality Date   BRAIN SURGERY     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    COLONOSCOPY     in [place] many yrs ago- he cannnot remember when or where    Social History:  reports that he has quit smoking. His smoking use included cigarettes. He has never used smokeless tobacco. He reports current alcohol use of about 1.0 standard drink of alcohol per week. He reports that he does not currently use drugs after having used the following drugs: Cocaine.  Allergies   Allergen Reactions   Penicillins Other (See Comments)    Makes pt hurt all over     Family History  Problem Relation Age of Onset   Colon polyps Neg Hx    Colon cancer Neg Hx    Esophageal cancer Neg Hx    Rectal cancer Neg Hx    Stomach cancer Neg Hx     Prior to Admission medications   Medication Sig Start Date End Date Taking? Authorizing Provider  acetaminophen (TYLENOL) 500 MG tablet Take 1 tablet (500 mg total) by mouth every 6 (six) hours as needed. 09/20/20   Reed, Tiffany L, DO  amLODipine (NORVASC) 5 MG tablet Take 1 tablet (5 mg total) by mouth daily. 10/26/22   Ngetich, Dinah C, NP  atorvastatin (LIPITOR) 10 MG tablet TAKE 1 TABLET(10 MG) BY MOUTH DAILY 06/14/23   Ngetich, Dinah C, NP  Emollient (CETAPHIL) cream Apply topically 2 (two) times daily. Both legs 04/23/22   Ngetich, Dinah C, NP  hydrocortisone 1 % ointment Apply 1 application topically 2 (two) times daily. 09/20/20   Ngetich, Dinah C, NP  mirtazapine (REMERON) 7.5 MG tablet TAKE 1 TABLET(7.5 MG) BY MOUTH AT BEDTIME 10/26/22   Ngetich, Dinah C, NP  nicotine (NICODERM CQ) 14 mg/24hr patch Apply 14 mg Patch topically daily  x 6 weeks then  Apply 7 mg patch Topically daily x 2 weeks then stop Avoid smoking while on patch 04/23/22   Ngetich, Dinah C, NP  sertraline (ZOLOFT) 25 MG tablet TAKE 1 TABLET(25 MG) BY MOUTH DAILY 10/26/22   Ngetich, Dinah C, NP  vitamin B-12 (CYANOCOBALAMIN) 1000 MCG tablet Take 1 tablet (1,000 mcg total) by mouth daily. 04/23/22   Ngetich, Donalee Citrin, NP    Physical Exam: Vitals:   06/23/23 0015 06/23/23 0200 06/23/23 0239 06/23/23 0515  BP: (!) 147/71 (!) 161/85  (!) 143/78  Pulse: 74 73  74  Resp: 17 13  15   Temp:   98 F (36.7 C)   SpO2: 96% 100%  98%  Weight:      Height:       Constitutional: NAD, calm, comfortable Respiratory: clear to auscultation bilaterally, no wheezing, no crackles. Normal respiratory effort. No accessory muscle use.  Cardiovascular: Regular rate and rhythm,  no murmurs / rubs / gallops. No extremity edema. 2+ pedal pulses. No carotid bruits.  Abdomen: Mild TTP, no guarding nor rebound Neurologic: CN 2-12 grossly intact. Sensation intact, DTR normal. Strength 5/5 in all 4.  Psychiatric: Normal judgment and insight. Normal mood.   Data Reviewed:    Labs on Admission: I have personally reviewed following labs and imaging studies  CBC: Recent Labs  Lab 06/22/23 2305  WBC 9.7  NEUTROABS 7.0  HGB 15.5  HCT 50.3  MCV 91.3  PLT 168   Basic Metabolic Panel: Recent Labs  Lab 06/22/23 2305 06/23/23 0302  NA 134* 136  K 4.6 3.6  CL 97* 99  CO2 22 21*  GLUCOSE 152* 114*  BUN 22 20  CREATININE 2.33* 1.86*  CALCIUM 8.6* 7.8*  PHOS  --  2.7   GFR: Estimated Creatinine Clearance: 32.7 mL/min (A) (by C-G formula based on SCr of 1.86 mg/dL (H)). Liver Function Tests: Recent Labs  Lab 06/22/23 2305 06/23/23 0302  AST 26  --   ALT 18  --   ALKPHOS 80  --   BILITOT 0.2*  --   PROT 7.2  --   ALBUMIN 3.9 3.2*   No results for input(s): "LIPASE", "AMYLASE" in the last 168 hours. No results for input(s): "AMMONIA" in the last 168 hours. Coagulation Profile: No results for input(s): "INR", "PROTIME" in the last 168 hours. Cardiac Enzymes: No results for input(s): "CKTOTAL", "CKMB", "CKMBINDEX", "TROPONINI" in the last 168 hours. BNP (last 3 results) No results for input(s): "PROBNP" in the last 8760 hours. HbA1C: No results for input(s): "HGBA1C" in the last 72 hours. CBG: No results for input(s): "GLUCAP" in the last 168 hours. Lipid Profile: No results for input(s): "CHOL", "HDL", "LDLCALC", "TRIG", "CHOLHDL", "LDLDIRECT" in the last 72 hours. Thyroid Function Tests: No results for input(s): "TSH", "T4TOTAL", "FREET4", "T3FREE", "THYROIDAB" in the last 72 hours. Anemia Panel: No results for input(s): "VITAMINB12", "FOLATE", "FERRITIN", "TIBC", "IRON", "RETICCTPCT" in the last 72 hours. Urine analysis:    Component Value  Date/Time   COLORURINE YELLOW 06/23/2023 0048   APPEARANCEUR HAZY (A) 06/23/2023 0048   LABSPEC 1.020 06/23/2023 0048   PHURINE 5.0 06/23/2023 0048   GLUCOSEU NEGATIVE 06/23/2023 0048   HGBUR NEGATIVE 06/23/2023 0048   BILIRUBINUR NEGATIVE 06/23/2023 0048   KETONESUR NEGATIVE 06/23/2023 0048   PROTEINUR 100 (A) 06/23/2023 0048   NITRITE NEGATIVE 06/23/2023 0048   LEUKOCYTESUR NEGATIVE 06/23/2023 0048    Radiological Exams on Admission: CT ABDOMEN PELVIS WO CONTRAST  Result Date: 06/23/2023  CLINICAL DATA:  Right lower quadrant pain EXAM: CT ABDOMEN AND PELVIS WITHOUT CONTRAST TECHNIQUE: Multidetector CT imaging of the abdomen and pelvis was performed following the standard protocol without IV contrast. RADIATION DOSE REDUCTION: This exam was performed according to the departmental dose-optimization program which includes automated exposure control, adjustment of the mA and/or kV according to patient size and/or use of iterative reconstruction technique. COMPARISON:  None Available. FINDINGS: Lower chest: Linear scarring in the lung bases.  No effusions. Hepatobiliary: No focal hepatic abnormality. Gallbladder unremarkable. Pancreas: No focal abnormality or ductal dilatation. Spleen: No focal abnormality.  Normal size. Adrenals/Urinary Tract: Small hyperdense exophytic lesions off the left kidney, the largest 11 mm. These likely reflect hemorrhagic cysts. No stones or hydronephrosis. Adrenal glands and urinary bladder unremarkable. Stomach/Bowel: Appendix not visualized. No pericecal inflammation. Stomach, large and small bowel grossly unremarkable. Vascular/Lymphatic: Aortic atherosclerosis. No evidence of aneurysm or adenopathy. Reproductive: Prostate enlargement Other: No free fluid or free air. Musculoskeletal: No acute bony abnormality. IMPRESSION: Nonvisualization of the appendix. No visible pericecal inflammation. Small hyperdense lesions within the left kidney measuring up to 11 mm, most  likely hemorrhagic cysts. This could be confirmed with nonemergent elective renal ultrasound. Aortic atherosclerosis. Prostate enlargement. No acute findings. Electronically Signed   By: Charlett Nose M.D.   On: 06/23/2023 01:25    EKG: Independently reviewed.  COVID-19: positive  Assessment and Plan: * AKI (acute kidney injury) (HCC) Likely pre-renal / ATN in setting of COVID-19 infection with predominantly GI symptoms causing decreased PO intake. IVF: 2L in ED followed by 100 cc/hr Strict intake and output CT AP was negative for obstruction or acute finding: Just lesions in R kidney likely cysts and recd non emergent [place]. Have ordered renal US UA ordered and pending Repeat BMP tomorrow AM  Nausea vomiting and diarrhea N/V/D and abd pain likely due to COVID-19 viral infection.  COVID-19 virus infection Primarily GI symptoms Has cough, but satting 100% on room air currently. Paxlovid considered, however: Pt takes a statin chronically and Patients symptoms are primarily GI, and paxlovid's side effects are primarily GI Therefore will hold off on starting this for the moment  Essential hypertension Cont amlodipine  Hyperlipidemia LDL goal <100 Cont statin      Advance Care Planning:   Code Status: Full Code  Consults: None  Family Communication: No family in room  Severity of Illness: The appropriate patient status for this patient is OBSERVATION. Observation status is judged to be reasonable and necessary in order to provide the required intensity of service to ensure the patient's safety. The patient's presenting symptoms, physical exam findings, and initial radiographic and laboratory data in the context of their medical condition is felt to place them at decreased risk for further clinical deterioration. Furthermore, it is anticipated that the patient will be medically stable for discharge from the hospital within 2 midnights of admission.   Author: Hillary Bow.,  DO 06/23/2023 5:25 AM  For on call review [email or web address].

## 2023-06-24 DIAGNOSIS — N179 Acute kidney failure, unspecified: Secondary | ICD-10-CM | POA: Diagnosis not present

## 2023-06-24 LAB — BASIC METABOLIC PANEL
Anion gap: 13 (ref 5–15)
CO2: 20 mmol/L — ABNORMAL LOW (ref 22–32)
Calcium: 8.4 mg/dL — ABNORMAL LOW (ref 8.9–10.3)
Chloride: 105 mmol/L (ref 98–111)
Creatinine, Ser: 1.3 mg/dL — ABNORMAL HIGH (ref 0.61–1.24)
GFR, Estimated: 57 mL/min — ABNORMAL LOW (ref >60)
Glucose, Bld: 91 mg/dL (ref 70–99)
Sodium: 138 mmol/L (ref 135–145)

## 2023-06-24 LAB — CBC
HCT: 47.1 % (ref 39.0–52.0)
Hemoglobin: 14.6 g/dL (ref 13.0–17.0)
MCHC: 31 g/dL (ref 30.0–36.0)
MCV: 89.5 fL (ref 80.0–100.0)
Platelets: 130 10*3/uL — ABNORMAL LOW (ref 150–400)
RBC: 5.26 MIL/uL (ref 4.22–5.81)
RDW: 14.2 % (ref 11.5–15.5)
WBC: 6.5 10*3/uL (ref 4.0–10.5)
nRBC: 0 % (ref 0.0–0.2)

## 2023-06-24 MED ORDER — ONDANSETRON HCL 4 MG PO TABS: 4.0000 mg | ORAL_TABLET | Freq: Four times a day (QID) | ORAL | 0 refills | Status: DC | PRN

## 2023-06-24 NOTE — Discharge Summary (Signed)
Physician Discharge Summary  Alexander Pruitt HYQ:[id number] DOB: Dec 08, 1945 DOA: 06/22/2023  PCP: [name], NP  Admit date: 06/22/2023 Discharge date: 06/24/2023  Admitted From: home Discharge disposition: home   Recommendations for Outpatient Follow-Up:   Bmp at next office visit   Discharge Diagnosis:   Principal Problem:   AKI (acute kidney injury) (HCC) Active Problems:   Nausea and vomiting   COVID-19 virus infection   Hyperlipidemia LDL goal <100   Essential hypertension    Discharge Condition: Improved.  Diet recommendation: Low sodium, heart healthy.   Wound care: None.  Code status: Full.   History of Present Illness:   Alexander Pruitt is a 77 y.o. male with medical history significant of HTN, HLD, MCI.   Pt presents to ED for evaluation of syncopal episode.   Pt with abd pain, N/V for past 24h.  Worse in RLQ.  Took bath this evening, after getting out of bath he sat on toilet and had syncopal episode.  Admits decreased PO intake due to GI symptoms.   No CP, SOB, headache, fever.  Does have non-productive cough over same time period.   Pt quit smoking 1 year ago he states.   PMH lists h/o EtOH dependence and alcoholic hepatitis; however, pt patient denies ever drinking heavily in past to me.   Hospital Course by Problem:    AKI (acute kidney injury) (HCC) Likely pre-renal / ATN in setting of COVID-19 infection with predominantly GI symptoms causing decreased PO intake. -resolved   Nausea vomiting and diarrhea -resolved   COVID-19 virus infection Primarily GI symptoms Paxlovid considered, however: Pt takes a statin chronically and Patients symptoms are primarily GI, and paxlovid's side effects are primarily GI Therefore will not give   Essential hypertension Cont amlodipine   Hyperlipidemia LDL goal <100 Cont statin    Medical Consultants:      Discharge Exam:   Vitals:   06/24/23 0533 06/24/23 0735  BP: 134/79 (!)  150/70  Pulse: 74 80  Resp: 18 18  Temp: 99.1 F (37.3 C) 99.1 F (37.3 C)  SpO2: 100% 100%   Vitals:   06/23/23 2103 06/24/23 0112 06/24/23 0533 06/24/23 0735  BP: (!) 157/82 134/81 134/79 (!) 150/70  Pulse: 83 65 74 80  Resp:  18 18 18   Temp: 99.2 F (37.3 C) 98.8 F (37.1 C) 99.1 F (37.3 C) 99.1 F (37.3 C)  TempSrc: Oral Oral Oral Oral  SpO2: 99% 100% 100% 100%  Weight:      Height:        General exam: Appears calm and comfortable.    The results of significant diagnostics from this hospitalization (including imaging, microbiology, ancillary and laboratory) are listed below for reference.     Procedures and Diagnostic Studies:   US RENAL  Result Date: 06/23/2023 CLINICAL DATA:  [id number], [id number]. Acute kidney injury. Multiple renal cysts. EXAM: RENAL / URINARY TRACT ULTRASOUND COMPLETE COMPARISON:  CT without contrast earlier today. Normal older studies. FINDINGS: Right Kidney: Renal measurements: 9.6 x 4.0 x 4.3 cm = volume: 87.0 mL. Echogenicity within normal limits. No mass or hydronephrosis visualized. There is a parapelvic simple cyst in the superior pole measuring 1.4 cm. There is a parapelvic simple cyst in the lower pole measuring 7.4 mm. Left Kidney: Renal measurements: 9.7 x 4.7 x 4.3 cm = volume: 103.8 mL. Echogenicity within normal limits. No solid mass or hydronephrosis visualized. There is a complex cystic lesion in the upper pole with a  thin septation and a questionable mural nodule. There was no positive color flow. This measures 1.7 cm. The CT demonstrated a 1 cm dense lesion of 64 Hounsfield units in the posterior midpole, indeterminate by density alone, which could not be seen on ultrasound and could have been obscured by bowel gas. Bladder: In general mildly thickened. No masslike thickening is seen. Hypertrophy or cystitis are the 2 main considerations. Other: No free fluid is seen. IMPRESSION: 1. Complex cystic lesion in the upper pole of the left kidney  measuring 1.7 cm with a thin septation and a questionable mural nodule. The CT demonstrated a 1 cm dense lesion in the posterior mid pole of the left kidney, which could not be seen on ultrasound and could have been obscured by bowel gas. MRI of the abdomen is recommended for further study. 2. Simple parapelvic cysts in the right kidney. 3. No hydronephrosis. 4. In general mildly thickened urinary bladder. Hypertrophy or cystitis are the 2 main considerations. Electronically Signed   By: Almira Bar M.D.   On: 06/23/2023 07:24   CT ABDOMEN PELVIS WO CONTRAST  Result Date: 06/23/2023 CLINICAL DATA:  Right lower quadrant pain EXAM: CT ABDOMEN AND PELVIS WITHOUT CONTRAST TECHNIQUE: Multidetector CT imaging of the abdomen and pelvis was performed following the standard protocol without IV contrast. RADIATION DOSE REDUCTION: This exam was performed according to the departmental dose-optimization program which includes automated exposure control, adjustment of the mA and/or kV according to patient size and/or use of iterative reconstruction technique. COMPARISON:  None Available. FINDINGS: Lower chest: Linear scarring in the lung bases.  No effusions. Hepatobiliary: No focal hepatic abnormality. Gallbladder unremarkable. Pancreas: No focal abnormality or ductal dilatation. Spleen: No focal abnormality.  Normal size. Adrenals/Urinary Tract: Small hyperdense exophytic lesions off the left kidney, the largest 11 mm. These likely reflect hemorrhagic cysts. No stones or hydronephrosis. Adrenal glands and urinary bladder unremarkable. Stomach/Bowel: Appendix not visualized. No pericecal inflammation. Stomach, large and small bowel grossly unremarkable. Vascular/Lymphatic: Aortic atherosclerosis. No evidence of aneurysm or adenopathy. Reproductive: Prostate enlargement Other: No free fluid or free air. Musculoskeletal: No acute bony abnormality. IMPRESSION: Nonvisualization of the appendix. No visible pericecal  inflammation. Small hyperdense lesions within the left kidney measuring up to 11 mm, most likely hemorrhagic cysts. This could be confirmed with nonemergent elective renal ultrasound. Aortic atherosclerosis. Prostate enlargement. No acute findings. Electronically Signed   By: Charlett Nose M.D.   On: 06/23/2023 01:25     Labs:   Basic Metabolic Panel: Recent Labs  Lab 06/22/23 2305 06/23/23 0302 06/24/23 0217  NA 134* 136 138  K 4.6 3.6 3.5  CL 97* 99 105  CO2 22 21* 20*  GLUCOSE 152* 114* 91  BUN 22 20 14   CREATININE 2.33* 1.86* 1.30*  CALCIUM 8.6* 7.8* 8.4*  PHOS  --  2.7  --    GFR Estimated Creatinine Clearance: 46.8 mL/min (A) (by C-G formula based on SCr of 1.3 mg/dL (H)). Liver Function Tests: Recent Labs  Lab 06/22/23 2305 06/23/23 0302  AST 26  --   ALT 18  --   ALKPHOS 80  --   BILITOT 0.2*  --   PROT 7.2  --   ALBUMIN 3.9 3.2*   Recent Labs  Lab 06/23/23 0134  LIPASE 30   No results for input(s): "AMMONIA" in the last 168 hours. Coagulation profile No results for input(s): "INR", "PROTIME" in the last 168 hours.  CBC: Recent Labs  Lab 06/22/23 2305 06/24/23 0217  WBC 9.7 6.5  NEUTROABS 7.0  --   HGB 15.5 14.6  HCT 50.3 47.1  MCV 91.3 89.5  PLT 168 130*   Cardiac Enzymes: No results for input(s): "CKTOTAL", "CKMB", "CKMBINDEX", "TROPONINI" in the last 168 hours. BNP: Invalid input(s): "POCBNP" CBG: No results for input(s): "GLUCAP" in the last 168 hours. D-Dimer No results for input(s): "DDIMER" in the last 72 hours. Hgb A1c No results for input(s): "HGBA1C" in the last 72 hours. Lipid Profile No results for input(s): "CHOL", "HDL", "LDLCALC", "TRIG", "CHOLHDL", "LDLDIRECT" in the last 72 hours. Thyroid function studies No results for input(s): "TSH", "T4TOTAL", "T3FREE", "THYROIDAB" in the last 72 hours.  Invalid input(s): "FREET3" Anemia work up No results for input(s): "VITAMINB12", "FOLATE", "FERRITIN", "TIBC", "IRON",  "RETICCTPCT" in the last 72 hours. Microbiology Recent Results (from the past 240 hour(s))  Resp panel by RT-PCR (RSV, Flu A&B, Covid) Anterior Nasal Swab     Status: Abnormal   Collection Time: 06/23/23  1:34 AM   Specimen: Anterior Nasal Swab  Result Value Ref Range Status   SARS Coronavirus 2 by RT PCR POSITIVE (A) NEGATIVE Final   Influenza A by PCR NEGATIVE NEGATIVE Final   Influenza B by PCR NEGATIVE NEGATIVE Final    Comment: (NOTE) The Xpert Xpress SARS-CoV-2/FLU/RSV plus assay is intended as an aid in the diagnosis of influenza from Nasopharyngeal swab specimens and should not be used as a sole basis for treatment. Nasal washings and aspirates are unacceptable for Xpert Xpress SARS-CoV-2/FLU/RSV testing.  Fact Sheet for Patients: BloggerCourse.com  Fact Sheet for Healthcare Providers: SeriousBroker.it  This test is not yet approved or cleared by the [place] FDA and has been authorized for detection and/or diagnosis of SARS-CoV-2 by FDA under an Emergency Use Authorization (EUA). This EUA will remain in effect (meaning this test can be used) for the duration of the COVID-19 declaration under Section 564(b)(1) of the Act, 21 U.S.C. section 360bbb-3(b)(1), unless the authorization is terminated or revoked.     Resp Syncytial Virus by PCR NEGATIVE NEGATIVE Final    Comment: (NOTE) Fact Sheet for Patients: BloggerCourse.com  Fact Sheet for Healthcare Providers: SeriousBroker.it  This test is not yet approved or cleared by the [place] FDA and has been authorized for detection and/or diagnosis of SARS-CoV-2 by FDA under an Emergency Use Authorization (EUA). This EUA will remain in effect (meaning this test can be used) for the duration of the COVID-19 declaration under Section 564(b)(1) of the Act, 21 U.S.C. section 360bbb-3(b)(1), unless the authorization is  terminated or revoked.  Performed at [facility], [address] [place], [place] [address]      Discharge Instructions:   Discharge Instructions     Diet general   Complete by: As directed    Increase activity slowly   Complete by: As directed       Allergies as of 06/24/2023       Reactions   Penicillins Other (See Comments)   Makes pt hurt all over         Medication List     STOP taking these medications    cyanocobalamin 1000 MCG tablet Commonly known as: VITAMIN B12   sertraline 25 MG tablet Commonly known as: ZOLOFT       TAKE these medications    acetaminophen 500 MG tablet Commonly known as: TYLENOL Take 1 tablet (500 mg total) by mouth every 6 (six) hours as needed. What changed:  how much to take when to  take this reasons to take this   amLODipine 5 MG tablet Commonly known as: NORVASC Take 1 tablet (5 mg total) by mouth daily.   atorvastatin 10 MG tablet Commonly known as: LIPITOR TAKE 1 TABLET(10 MG) BY MOUTH DAILY   hydrocortisone 1 % ointment Apply 1 application topically 2 (two) times daily.   mirtazapine 7.5 MG tablet Commonly known as: REMERON TAKE 1 TABLET(7.5 MG) BY MOUTH AT BEDTIME   ondansetron 4 MG tablet Commonly known as: ZOFRAN Take 1 tablet (4 mg total) by mouth every 6 (six) hours as needed for nausea.          Time coordinating discharge: 45 min  Signed:  Joseph Art DO  [facility] 06/24/2023, 10:27 AM

## 2023-06-24 NOTE — Plan of Care (Signed)
Patient alert/oriented X4. Patient compliant with medication administration and remains on spec. Airborne/contact precautions for COVID-19. Patient PIV removed prior to discharge and patient belongings packed up at bedside. AVS discharge instructions explained in detail and VSS. No complaints at this time.   Problem: Education: Goal: Knowledge of General Education information will improve Description: Including pain rating scale, medication(s)/side effects and non-pharmacologic comfort measures Outcome: Adequate for Discharge   Problem: Health Behavior/Discharge Planning: Goal: Ability to manage health-related needs will improve Outcome: Adequate for Discharge   Problem: Clinical Measurements: Goal: Ability to maintain clinical measurements within normal limits will improve Outcome: Adequate for Discharge   Problem: Clinical Measurements: Goal: Will remain free from infection Outcome: Adequate for Discharge   Problem: Clinical Measurements: Goal: Diagnostic test results will improve Outcome: Adequate for Discharge   Problem: Clinical Measurements: Goal: Respiratory complications will improve Outcome: Adequate for Discharge   Problem: Activity: Goal: Risk for activity intolerance will decrease Outcome: Adequate for Discharge   Problem: Nutrition: Goal: Adequate nutrition will be maintained Outcome: Adequate for Discharge   Problem: Coping: Goal: Level of anxiety will decrease Outcome: Adequate for Discharge   Problem: Elimination: Goal: Will not experience complications related to bowel motility Outcome: Adequate for Discharge   Problem: Elimination: Goal: Will not experience complications related to urinary retention Outcome: Adequate for Discharge   Problem: Pain Managment: Goal: General experience of comfort will improve Outcome: Adequate for Discharge   Problem: Safety: Goal: Ability to remain free from injury will improve Outcome: Adequate for Discharge    Problem: Skin Integrity: Goal: Risk for impaired skin integrity will decrease Outcome: Adequate for Discharge

## 2023-06-24 NOTE — TOC Transition Note (Signed)
Transition of Care [facility]) - CM/SW Discharge Note   Patient Details  Name: Alexander Pruitt MRN: [id number] Date of Birth: Feb 17, 1946  [facility]) CM/SW Contact:  Harriet Masson, RN Phone Number: 06/24/2023, 12:46 PM   Clinical Narrative:    Patient stable for discharge.  Paitent lives with son and uses [employer] transport to apts. Son states patient has [employer] and doesn't wish to start [place] medicaid because patient lives both in [place] and [place]. Address, Phone number and PCP verified.   Final next level of care: Home/Self Care Barriers to Discharge: Barriers Resolved   Patient Goals and CMS Choice    Return home  Discharge Placement    home                     Discharge Plan and Services Additional resources added to the After Visit Summary for                                       Social Determinants of Health (SDOH) Interventions SDOH Screenings   Food Insecurity: No Food Insecurity (06/23/2023)  Housing: Low Risk  (06/23/2023)  Transportation Needs: Unmet Transportation Needs (06/23/2023)  Utilities: Not At Risk (06/23/2023)  Depression (PHQ2-9): Low Risk  (03/29/2023)  Tobacco Use: Medium Risk (06/23/2023)     Readmission Risk Interventions     No data to display

## 2023-06-24 NOTE — Care Management Obs Status (Signed)
MEDICARE OBSERVATION STATUS NOTIFICATION   Patient Details  Name: Quantay Zaremba MRN: [id number] Date of Birth: [birth year]   Medicare Observation Status Notification Given:  Yes    Harriet Masson, RN 06/24/2023, 9:54 AM

## 2024-01-09 ENCOUNTER — Other Ambulatory Visit: Payer: Self-pay | Admitting: Family

## 2024-01-09 DIAGNOSIS — I129 Hypertensive chronic kidney disease with stage 1 through stage 4 chronic kidney disease, or unspecified chronic kidney disease: Secondary | ICD-10-CM

## 2024-04-03 ENCOUNTER — Encounter: Payer: Medicare Other | Admitting: Adult Health

## 2024-07-07 ENCOUNTER — Other Ambulatory Visit: Payer: Self-pay | Admitting: Family

## 2024-07-07 DIAGNOSIS — E785 Hyperlipidemia, unspecified: Secondary | ICD-10-CM

## 2024-09-26 NOTE — Progress Notes (Signed)
 Alexander Pruitt                                          MRN: [id number]   09/26/2024   The [facility] Quality Team Specialist reviewed this patient medical record for the purposes of chart review for care gap closure. The following were reviewed: chart review for care gap closure-kidney health evaluation for diabetes:eGFR  and uACR.    [facility] Quality Team

## 2024-09-29 ENCOUNTER — Ambulatory Visit: Admitting: Family

## 2024-10-06 ENCOUNTER — Encounter: Payer: Self-pay | Admitting: Family

## 2024-10-06 ENCOUNTER — Ambulatory Visit (INDEPENDENT_AMBULATORY_CARE_PROVIDER_SITE_OTHER): Admitting: Family

## 2024-10-06 VITALS — BP 148/94 | HR 60 | Temp 97.8°F | Resp 19 | Ht 68.0 in | Wt 162.6 lb

## 2024-10-06 DIAGNOSIS — I129 Hypertensive chronic kidney disease with stage 1 through stage 4 chronic kidney disease, or unspecified chronic kidney disease: Secondary | ICD-10-CM

## 2024-10-06 DIAGNOSIS — M1612 Unilateral primary osteoarthritis, left hip: Secondary | ICD-10-CM

## 2024-10-06 DIAGNOSIS — E785 Hyperlipidemia, unspecified: Secondary | ICD-10-CM

## 2024-10-06 DIAGNOSIS — N183 Chronic kidney disease, stage 3 unspecified: Secondary | ICD-10-CM

## 2024-10-06 DIAGNOSIS — L309 Dermatitis, unspecified: Secondary | ICD-10-CM

## 2024-10-06 DIAGNOSIS — F418 Other specified anxiety disorders: Secondary | ICD-10-CM

## 2024-10-06 DIAGNOSIS — Z23 Encounter for immunization: Secondary | ICD-10-CM | POA: Diagnosis not present

## 2024-10-06 MED ORDER — HYDROCORTISONE 1 % EX OINT
1.0000 | TOPICAL_OINTMENT | Freq: Two times a day (BID) | CUTANEOUS | 1 refills | Status: AC
Start: 2024-10-06 — End: ?

## 2024-10-06 MED ORDER — LIDOCAINE 5 % EX PTCH
1.0000 | MEDICATED_PATCH | CUTANEOUS | 0 refills | Status: AC
Start: 2024-10-06 — End: ?

## 2024-10-06 MED ORDER — AMLODIPINE BESYLATE 5 MG PO TABS
5.0000 mg | ORAL_TABLET | Freq: Every day | ORAL | 1 refills | Status: AC
Start: 2024-10-06 — End: ?

## 2024-10-06 NOTE — Patient Instructions (Addendum)
 1.Report to local pharmacy to receive Covid,Shingles,& Tdap Vaccines. 2.Stop at check out and schedule Annual Wellness Visit. 3. Please get left hip X-ray at [facility] imaging at [facility] then will call you with results.

## 2024-10-12 NOTE — Progress Notes (Signed)
 Provider: Roxan Plough FNP-C  Alexander Pruitt, Roxan BROCKS, NP  Patient Care Team: Alexander Pruitt, Roxan BROCKS, NP as PCP - General (Family Medicine)  Extended Emergency Contact Information Primary Emergency Contact: Alexander Pruitt Address: [address]          [place], [place] [address] [place] Home Phone: [phone] Mobile Phone: [phone] Relation: Son Secondary Emergency Contact: Alexander Pruitt Mobile Phone: [phone] Relation: Relative  Code Status: Full code Goals of care: Advanced Directive information    10/06/2024    1:27 PM  Advanced Directives  Does Patient Have a Medical Advance Directive? No  Would patient like information on creating a medical advance directive? No - Patient declined     Chief Complaint  Patient presents with   Hip Pain    Legs aching & would like refills    Discussed the use of AI scribe software for clinical note transcription with the patient, who gave verbal consent to proceed.  History of Present Illness   Alexander Pruitt is a 78 year old male with hypertension who presents with left hip pain.  He experiences left hip pain that radiates to his left thigh, worsening with cold weather and rain. In 2021, a specialist suggested the possibility of a hip replacement due to the condition of his hip, but he did not pursue further treatment. He has not had an x-ray since 2021.  He is concerned about his blood pressure, which he believes is affected by stress and worry. He has been out of his blood pressure medication, amlodipine , for at least a month. He also takes atorvastatin  10 mg for cholesterol management. He does not have a blood pressure machine at home to monitor his levels.  He uses hydrocortisone  cream for dry skin on his legs, which he has run out of. He also uses another cream for scaling.  He has a good appetite and his son, who is a investment banker, operational, ensures he eats well, including fruits. He drinks water regularly, about one bottle a  day.  No current issues with anxiety or depression and he has stopped taking Zofran  and Remeron . He has not had any lab work done in the past year.   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 Remove & Discard patch within 12 hours or as directed by MD   acetaminophen  (TYLENOL ) 500 MG tablet  Take 1 tablet (500 mg total) by mouth every 6 (six) hours as needed. (Patient not taking: Reported on 10/06/2024)   amLODipine  (NORVASC ) 5 MG tablet Take 1 tablet (5 mg total) by mouth daily.   hydrocortisone  1 % ointment Apply 1 Application topically 2 (two) times daily.   [DISCONTINUED] amLODipine  (NORVASC ) 5 MG tablet TAKE 1 TABLET(5 MG) BY MOUTH DAILY (Patient not taking: Reported on 10/06/2024)   [DISCONTINUED] hydrocortisone  1 % ointment Apply 1 application topically 2 (two) times daily. (Patient not taking: Reported on 10/06/2024)   [DISCONTINUED] mirtazapine  (REMERON ) 7.5 MG tablet TAKE 1 TABLET(7.5 MG) BY MOUTH AT BEDTIME (Patient not taking: Reported on 10/06/2024)   [DISCONTINUED] ondansetron  (ZOFRAN ) 4 MG tablet  Take 1 tablet (4 mg total) by mouth every 6 (six) hours as needed for nausea. (Patient not taking: Reported on 10/06/2024)   No facility-administered encounter medications on file as of 10/06/2024.    Review of Systems  Constitutional:  Negative for appetite change, chills, fatigue, fever and unexpected weight change.  HENT:  Negative for congestion, dental problem, ear discharge, ear pain, facial swelling, hearing loss, nosebleeds, postnasal drip, rhinorrhea, sinus pressure, sinus pain, sneezing, sore throat, tinnitus and trouble swallowing.   Eyes:  Negative for pain, discharge, redness, itching and visual disturbance.  Respiratory:  Negative for cough, chest tightness, shortness of breath and wheezing.   Cardiovascular:  Negative for chest pain, palpitations and leg swelling.  Gastrointestinal:  Negative for abdominal distention, abdominal pain, blood in stool, constipation, diarrhea, nausea and vomiting.  Endocrine: Negative for cold intolerance, heat intolerance, polydipsia, polyphagia and polyuria.  Genitourinary:  Negative for difficulty urinating, dysuria, flank pain, frequency and urgency.  Musculoskeletal:  Positive for arthralgias. Negative for back pain, gait problem, joint swelling, myalgias, neck pain and neck stiffness.       Left hip pain  Skin:  Negative for color change, pallor, rash and wound.  Neurological:  Negative for dizziness, syncope, speech difficulty, weakness, light-headedness, numbness and headaches.  Hematological:  Does not bruise/bleed easily.  Psychiatric/Behavioral:  Negative for agitation, behavioral problems, confusion, hallucinations, self-injury, sleep disturbance and suicidal ideas. The patient is not nervous/anxious.     Immunization History  Administered Date(s) Administered   Fluad Quad(high Dose 65+) 09/20/2020, 08/28/2021, 10/26/2022   INFLUENZA, HIGH DOSE SEASONAL PF 10/06/2024   Moderna Sars-Covid-2 Vaccination 11/05/2020   Pneumococcal  Conjugate-13 07/02/2009   Pneumococcal Polysaccharide-23 01/27/2021   Pertinent  Health Maintenance Due  Topic Date Due   Influenza Vaccine  Completed   Colonoscopy  Discontinued      04/23/2022    8:23 AM 05/04/2022   10:24 AM 10/26/2022   10:21 AM 03/29/2023    9:16 AM 10/06/2024    1:27 PM  Fall Risk  Falls in the past year? 0 0 0 0 0  Was there an injury with Fall? 0 0 0 0 0  Fall Risk Category Calculator 0 0 0 0 0  Fall Risk Category (Retired) Low  Low  Low     (RETIRED) Patient Fall Risk Level Low fall risk  Low fall risk  Low fall risk     Patient at Risk for Falls Due to No Fall Risks No Fall Risks No Fall Risks History of fall(s) No Fall Risks  Fall risk Follow up Falls evaluation completed  Falls evaluation completed  Falls evaluation completed  Falls evaluation completed;Education provided;Falls prevention discussed Falls evaluation completed     Data saved with a previous flowsheet row definition  Functional Status Survey:    Vitals:   10/06/24 1330 10/06/24 1404  BP: (!) 160/98 (!) 148/94  Pulse: 60   Resp: 19   Temp: 97.8 F (36.6 C)   SpO2: 99%   Weight: 162 lb 9.6 oz (73.8 kg)   Height: 5' 8 (1.727 m)    Body mass index is 24.72 kg/m. Physical Exam VITALS: BP- 148/94 GENERAL: Alert, cooperative, well developed, no acute distress. HEENT: Normocephalic, normal oropharynx, moist mucous membranes. CHEST: Clear to auscultation bilaterally, no wheezes, rhonchi, or crackles. CARDIOVASCULAR: Normal heart rate and rhythm, S1 and S2 normal without murmurs. ABDOMEN: Soft, non-tender, non-distended, without organomegaly, normal bowel sounds, no costovertebral angle tenderness. EXTREMITIES: No cyanosis or edema. NEUROLOGICAL: Cranial nerves grossly intact, moves all extremities without gross motor or sensory deficit.  SKIN: No rash,no lesion or erythema   PSYCHIATRY/BEHAVIORAL: Mood stable    Labs reviewed: No results for input(s): NA, K, CL, CO2,  GLUCOSE, BUN, CREATININE, CALCIUM , MG, PHOS in the last 8760 hours. No results for input(s): AST, ALT, ALKPHOS, BILITOT, PROT, ALBUMIN in the last 8760 hours. No results for input(s): WBC, NEUTROABS, HGB, HCT, MCV, PLT in the last 8760 hours. Lab Results  Component Value Date   TSH 2.84 11/09/2022   Lab Results  Component Value Date   HGBA1C 5.2 04/23/2022   Lab Results  Component Value Date   CHOL 152 11/09/2022   HDL 61 11/09/2022   LDLCALC 76 11/09/2022   TRIG 74 11/09/2022   CHOLHDL 2.5 11/09/2022    Significant Diagnostic Results in last 30 days:  No results found.  Assessment/Plan    Osteoarthritis of left hip Chronic osteoarthritis of the left hip with worsening pain, exacerbated by weather changes. Previous imaging in 2021 showed significant findings. He has not pursued further intervention due to personal preference and family concerns about surgery. No high-risk factors for surgery such as diabetes. Discussed potential for hip replacement and alternative pain management options. - Ordered x-ray of left hip - Referred to orthopedic specialist for further evaluation and management - Prescribed lidocaine patch for pain management - Continue Tylenol  for pain  Hypertension Recent lapse in medication adherence due to running out of amlodipine . Blood pressure remains elevated at 148/94 mmHg. Discussed importance of medication adherence and relaxation techniques to manage blood pressure. - Prescribed amlodipine  5 mg with a 90-day supply and one refill - Advised to monitor blood pressure at home - Encouraged relaxation techniques to manage stress and blood pressure  Hyperlipidemia Managed with atorvastatin  10 mg. No recent lab work to assess lipid levels. - Continue atorvastatin  10 mg - Scheduled lab work to assess lipid levels  Dermatitis of lower extremities Chronic dermatitis on lower extremities, previously managed with  hydrocortisone  cream. He reports running out of medication and using other creams for scaling. - Prescribed hydrocortisone  cream - Advised use of moisturizer for skin hydration  General Health Maintenance Immunizations are up to date except for tetanus, shingles, and COVID vaccines. Discussed importance of flu shot and other vaccinations. - Advised to receive flu shot - Advised to receive tetanus, shingles, and COVID vaccines at pharmacy   Family/ staff Communication: Reviewed plan of care with patient verbalized understanding  Labs/tests ordered:  - TSH; Future - CBC with Differential/Platelet; Future - Complete Metabolic Panel with eGFR; Future - DG Hip Unilat W OR W/O Pelvis 2-3 Views Left; Future - Lipid panel; Future   Next Appointment: Return in about 6 months (around 04/05/2025) for medical mangement of chronic issues.,  fasting labs in one week.   Total time: 25 minutes. Greater than 50% of total time spent doing patient education regarding Left hip pain HTN, HLD,Dermatitis,health maintenance including symptom/medication management.   Roxan JAYSON Plough, NP

## 2024-10-13 ENCOUNTER — Other Ambulatory Visit

## 2024-10-16 ENCOUNTER — Other Ambulatory Visit

## 2025-01-05 ENCOUNTER — Other Ambulatory Visit: Payer: Self-pay | Admitting: Family

## 2025-01-05 DIAGNOSIS — E785 Hyperlipidemia, unspecified: Secondary | ICD-10-CM

## 2025-04-06 ENCOUNTER — Ambulatory Visit: Admitting: Family
